# Patient Record
Sex: Male | Born: 1990 | Race: Black or African American | Hispanic: No | Marital: Single | Smoking: Current every day smoker
Health system: Southern US, Community
[De-identification: ages and names within clinical notes are randomized; demographics above are authoritative.]

## PROBLEM LIST (undated history)

## (undated) DIAGNOSIS — T4145XA Adverse effect of unspecified anesthetic, initial encounter: Secondary | ICD-10-CM

## (undated) DIAGNOSIS — F419 Anxiety disorder, unspecified: Secondary | ICD-10-CM

## (undated) DIAGNOSIS — T8859XA Other complications of anesthesia, initial encounter: Secondary | ICD-10-CM

## (undated) DIAGNOSIS — S21119A Laceration without foreign body of unspecified front wall of thorax without penetration into thoracic cavity, initial encounter: Secondary | ICD-10-CM

## (undated) SURGERY — Surgical Case
Anesthesia: *Unknown

---
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
## 2017-10-18 ENCOUNTER — Emergency Department (HOSPITAL_BASED_OUTPATIENT_CLINIC_OR_DEPARTMENT_OTHER)
Admission: EM | Admit: 2017-10-18 | Discharge: 2017-10-19 | Disposition: A | Discharge status: Home / Self Care | Payer: Medicaid Other | Attending: Emergency Medicine | Admitting: Emergency Medicine

## 2017-10-18 ENCOUNTER — Encounter (HOSPITAL_BASED_OUTPATIENT_CLINIC_OR_DEPARTMENT_OTHER): Payer: Self-pay | Admitting: *Deleted

## 2017-10-18 ENCOUNTER — Other Ambulatory Visit: Payer: Self-pay

## 2017-10-18 DIAGNOSIS — K0889 Other specified disorders of teeth and supporting structures: Secondary | ICD-10-CM | POA: Insufficient documentation

## 2017-10-18 DIAGNOSIS — F17200 Nicotine dependence, unspecified, uncomplicated: Secondary | ICD-10-CM | POA: Diagnosis not present

## 2017-10-18 MED ORDER — IBUPROFEN 800 MG PO TABS: 800.0000 mg | ORAL_TABLET | Freq: Three times a day (TID) | ORAL | 0 refills | Status: DC | PRN

## 2017-10-18 MED ORDER — IBUPROFEN 800 MG PO TABS
800.0000 mg | ORAL_TABLET | Freq: Once | ORAL | Status: AC
  Administered 2017-10-18: 800 mg via ORAL
  Filled 2017-10-18: qty 1

## 2017-10-18 MED ORDER — PENICILLIN V POTASSIUM 500 MG PO TABS: 500.0000 mg | ORAL_TABLET | Freq: Three times a day (TID) | ORAL | 0 refills | Status: DC

## 2017-10-18 MED ORDER — PENICILLIN V POTASSIUM 250 MG PO TABS
500.0000 mg | ORAL_TABLET | Freq: Once | ORAL | Status: AC
  Administered 2017-10-18: 500 mg via ORAL
  Filled 2017-10-18: qty 2

## 2017-10-18 NOTE — ED Triage Notes (Signed)
Pt reports right lower wisdom tooth pain for about 2 weeks. Denies drainage or fevers. He took some penicillin that he had from an old prescription and bc powder today.

## 2017-10-18 NOTE — Discharge Instructions (Addendum)
Your evaluated in the emergency department for dental pain. On your exam it appears that your wisdom teeth are erupting. We are prescribing you ibuprofen and penicillin. We are also giving you a number for a dentist. Please followup with dentist for evaluation.

## 2017-10-18 NOTE — ED Provider Notes (Signed)
[facility] EMERGENCY DEPARTMENT Provider Note   CSN: [id number] Arrival date & time: 10/18/17  2153     History   Chief Complaint Chief Complaint  Patient presents with  . Dental Pain    HPI Joseph Stokes is a 27 y.o. male.  He is complaining of 2 weeks of posterior molar pain on both sides of his jaw.  He denies any fever.  He took some Tylenol and a dose of penicillin without any relief.  He is a smoker.  He denies any facial swelling.  No difficulty speaking or swallowing.  He has no dental care.  No trauma  The history is provided by the patient.  Dental Pain   This is a new problem. The current episode started more than 1 week ago. The problem occurs constantly. The problem has not changed since onset.The pain is moderate. He has tried acetaminophen for the symptoms. The treatment provided mild relief.    History reviewed. No pertinent past medical history.  There are no active problems to display for this patient.   History reviewed. No pertinent surgical history.      Home Medications    Prior to Admission medications   Medication Sig Start Date End Date Taking? Authorizing Provider  ibuprofen (ADVIL,MOTRIN) 800 MG tablet Take 1 tablet (800 mg total) by mouth every 8 (eight) hours as needed. 10/18/17   Terrilee FilesButler, Michael C, MD  penicillin v potassium (VEETID) 500 MG tablet Take 1 tablet (500 mg total) by mouth 3 (three) times daily. 10/18/17   Terrilee FilesButler, Michael C, MD    Family History No family history on file.  Social History Social History   Tobacco Use  . Smoking status: Current Every Day Smoker  Substance Use Topics  . Alcohol use: Not Currently  . Drug use: Not Currently     Allergies   Patient has no known allergies.   Review of Systems Review of Systems  Constitutional: Negative for fever.  HENT: Positive for dental problem. Negative for sore throat.   Respiratory: Negative for shortness of breath.   Cardiovascular: Negative for chest  pain.  Gastrointestinal: Negative for abdominal pain.  Genitourinary: Negative for dysuria.  Skin: Negative for rash.     Physical Exam Updated Vital Signs BP (!) 120/59 (BP Location: Left Arm)   Pulse 72   Temp 98.2 F (36.8 C) (Oral)   Resp 14   Ht 5\' 4"  (1.626 m)   Wt 61.2 kg (135 lb)   SpO2 100%   BMI 23.17 kg/m   Physical Exam  Constitutional: He appears well-developed and well-nourished.  HENT:  Head: Normocephalic and atraumatic.  Right Ear: External ear normal.  Left Ear: External ear normal.  Nose: Nose normal.  Mouth/Throat: Oropharynx is clear and moist. No oropharyngeal exudate.  He has bilateral wisdom tooth lower eruptions.  There is some gingival inflammation in the area but no fluctuance and no discharge.  There is no facial swelling.  No trismus and no neck crepitus or erythema.  Eyes: Conjunctivae are normal.  Neck: Neck supple.  Pulmonary/Chest: Effort normal.  Neurological: He is alert. GCS eye subscore is 4. GCS verbal subscore is 5. GCS motor subscore is 6.  Skin: Skin is warm and dry.  Psychiatric: He has a normal mood and affect.  Nursing note and vitals reviewed.    ED Treatments / Results  Labs (all labs ordered are listed, but only abnormal results are displayed) Labs Reviewed - No data to display  EKG None  Radiology No results found.  Procedures Procedures (including critical care time)  Medications Ordered in ED Medications  ibuprofen (ADVIL,MOTRIN) tablet 800 mg (has no administration in time range)  penicillin v potassium (VEETID) tablet 500 mg (has no administration in time range)     Initial Impression / Assessment and Plan / ED Course  I have reviewed the triage vital signs and the nursing notes.  Pertinent labs & imaging results that were available during my care of the patient were reviewed by me and considered in my medical decision making (see chart for details).  Clinical Course as of Oct 19 1049  Fri Oct 18, 2017  2334 Patient presents with dental pain for 2 weeks.  Is nontoxic here and afebrile.  On exam has wisdom tooth eruptions.  I am covering him with antibiotics although I doubt infection is a major cause of this.  Ibuprofen prescription also given.  He understands the need for follow-up with dentist.   [MB]    Clinical Course User Index [MB] Terrilee Files, MD      Final Clinical Impressions(s) / ED Diagnoses   Final diagnoses:  Pain, dental    ED Discharge Orders        Ordered    ibuprofen (ADVIL,MOTRIN) 800 MG tablet  Every 8 hours PRN     10/18/17 2336    penicillin v potassium (VEETID) 500 MG tablet  3 times daily     10/18/17 2336       Terrilee Files, MD 10/19/17 1051

## 2019-04-27 ENCOUNTER — Emergency Department (HOSPITAL_COMMUNITY): Payer: Medicaid Other

## 2019-04-27 ENCOUNTER — Encounter (HOSPITAL_COMMUNITY): Payer: Self-pay | Admitting: Urology

## 2019-04-27 ENCOUNTER — Other Ambulatory Visit: Payer: Self-pay

## 2019-04-27 ENCOUNTER — Inpatient Hospital Stay (HOSPITAL_COMMUNITY)
Admission: EM | Admit: 2019-04-27 | Discharge: 2019-05-07 | DRG: 956 | Disposition: A | Discharge status: Rehabilitation Facility | Payer: Medicaid Other | Attending: General Surgery | Admitting: General Surgery

## 2019-04-27 DIAGNOSIS — W3400XA Accidental discharge from unspecified firearms or gun, initial encounter: Secondary | ICD-10-CM | POA: Diagnosis not present

## 2019-04-27 DIAGNOSIS — T1490XA Injury, unspecified, initial encounter: Secondary | ICD-10-CM | POA: Diagnosis not present

## 2019-04-27 DIAGNOSIS — Z79899 Other long term (current) drug therapy: Secondary | ICD-10-CM

## 2019-04-27 DIAGNOSIS — S85182A Other specified injury of posterior tibial artery, left leg, initial encounter: Secondary | ICD-10-CM | POA: Diagnosis present

## 2019-04-27 DIAGNOSIS — E875 Hyperkalemia: Secondary | ICD-10-CM | POA: Diagnosis not present

## 2019-04-27 DIAGNOSIS — G8918 Other acute postprocedural pain: Secondary | ICD-10-CM

## 2019-04-27 DIAGNOSIS — S85891A Other specified injury of other blood vessels at lower leg level, right leg, initial encounter: Secondary | ICD-10-CM | POA: Diagnosis not present

## 2019-04-27 DIAGNOSIS — S3729XA Other injury of bladder, initial encounter: Secondary | ICD-10-CM | POA: Diagnosis present

## 2019-04-27 DIAGNOSIS — F411 Generalized anxiety disorder: Secondary | ICD-10-CM | POA: Diagnosis not present

## 2019-04-27 DIAGNOSIS — Z634 Disappearance and death of family member: Secondary | ICD-10-CM | POA: Diagnosis not present

## 2019-04-27 DIAGNOSIS — D62 Acute posthemorrhagic anemia: Secondary | ICD-10-CM

## 2019-04-27 DIAGNOSIS — T148XXA Other injury of unspecified body region, initial encounter: Secondary | ICD-10-CM

## 2019-04-27 DIAGNOSIS — S76122A Laceration of left quadriceps muscle, fascia and tendon, initial encounter: Secondary | ICD-10-CM | POA: Diagnosis present

## 2019-04-27 DIAGNOSIS — I959 Hypotension, unspecified: Secondary | ICD-10-CM | POA: Diagnosis present

## 2019-04-27 DIAGNOSIS — D696 Thrombocytopenia, unspecified: Secondary | ICD-10-CM | POA: Diagnosis not present

## 2019-04-27 DIAGNOSIS — S52201B Unspecified fracture of shaft of right ulna, initial encounter for open fracture type I or II: Secondary | ICD-10-CM | POA: Diagnosis present

## 2019-04-27 DIAGNOSIS — T07XXXA Unspecified multiple injuries, initial encounter: Secondary | ICD-10-CM | POA: Diagnosis not present

## 2019-04-27 DIAGNOSIS — S91331A Puncture wound without foreign body, right foot, initial encounter: Secondary | ICD-10-CM | POA: Diagnosis present

## 2019-04-27 DIAGNOSIS — D72829 Elevated white blood cell count, unspecified: Secondary | ICD-10-CM

## 2019-04-27 DIAGNOSIS — Z20828 Contact with and (suspected) exposure to other viral communicable diseases: Secondary | ICD-10-CM | POA: Diagnosis present

## 2019-04-27 DIAGNOSIS — S92001B Unspecified fracture of right calcaneus, initial encounter for open fracture: Secondary | ICD-10-CM | POA: Diagnosis present

## 2019-04-27 DIAGNOSIS — S32451A Displaced transverse fracture of right acetabulum, initial encounter for closed fracture: Secondary | ICD-10-CM | POA: Diagnosis present

## 2019-04-27 DIAGNOSIS — R7401 Elevation of levels of liver transaminase levels: Secondary | ICD-10-CM | POA: Diagnosis not present

## 2019-04-27 DIAGNOSIS — S82452A Displaced comminuted fracture of shaft of left fibula, initial encounter for closed fracture: Secondary | ICD-10-CM | POA: Diagnosis present

## 2019-04-27 DIAGNOSIS — E8809 Other disorders of plasma-protein metabolism, not elsewhere classified: Secondary | ICD-10-CM | POA: Diagnosis not present

## 2019-04-27 DIAGNOSIS — Z419 Encounter for procedure for purposes other than remedying health state, unspecified: Secondary | ICD-10-CM

## 2019-04-27 DIAGNOSIS — N179 Acute kidney failure, unspecified: Secondary | ICD-10-CM | POA: Diagnosis present

## 2019-04-27 DIAGNOSIS — S85892A Other specified injury of other blood vessels at lower leg level, left leg, initial encounter: Secondary | ICD-10-CM | POA: Diagnosis not present

## 2019-04-27 DIAGNOSIS — Z23 Encounter for immunization: Secondary | ICD-10-CM | POA: Diagnosis not present

## 2019-04-27 DIAGNOSIS — S32591A Other specified fracture of right pubis, initial encounter for closed fracture: Secondary | ICD-10-CM | POA: Diagnosis present

## 2019-04-27 DIAGNOSIS — Z1810 Retained metal fragments, unspecified: Secondary | ICD-10-CM | POA: Diagnosis not present

## 2019-04-27 DIAGNOSIS — G479 Sleep disorder, unspecified: Secondary | ICD-10-CM | POA: Diagnosis not present

## 2019-04-27 DIAGNOSIS — R000 Tachycardia, unspecified: Secondary | ICD-10-CM | POA: Diagnosis not present

## 2019-04-27 DIAGNOSIS — E46 Unspecified protein-calorie malnutrition: Secondary | ICD-10-CM | POA: Diagnosis not present

## 2019-04-27 DIAGNOSIS — E871 Hypo-osmolality and hyponatremia: Secondary | ICD-10-CM | POA: Diagnosis not present

## 2019-04-27 DIAGNOSIS — Z452 Encounter for adjustment and management of vascular access device: Secondary | ICD-10-CM

## 2019-04-27 DIAGNOSIS — S32810D Multiple fractures of pelvis with stable disruption of pelvic ring, subsequent encounter for fracture with routine healing: Secondary | ICD-10-CM | POA: Diagnosis not present

## 2019-04-27 DIAGNOSIS — S72051A Unspecified fracture of head of right femur, initial encounter for closed fracture: Principal | ICD-10-CM | POA: Diagnosis present

## 2019-04-27 DIAGNOSIS — F17210 Nicotine dependence, cigarettes, uncomplicated: Secondary | ICD-10-CM | POA: Diagnosis present

## 2019-04-27 DIAGNOSIS — D72828 Other elevated white blood cell count: Secondary | ICD-10-CM | POA: Diagnosis not present

## 2019-04-27 HISTORY — DX: Laceration without foreign body of unspecified front wall of thorax without penetration into thoracic cavity, initial encounter: S21.119A

## 2019-04-27 HISTORY — DX: Anxiety disorder, unspecified: F41.9

## 2019-04-27 LAB — URINALYSIS, ROUTINE W REFLEX MICROSCOPIC
Bacteria, UA: NONE SEEN
Bilirubin Urine: NEGATIVE
Glucose, UA: NEGATIVE mg/dL
Ketones, ur: NEGATIVE mg/dL
Nitrite: NEGATIVE
Protein, ur: NEGATIVE mg/dL
RBC / HPF: >50 RBC/hpf — ABNORMAL HIGH (ref 0–5)
Specific Gravity, Urine: >1.046 — ABNORMAL HIGH (ref 1.005–1.030)
pH: 6 (ref 5.0–8.0)

## 2019-04-27 LAB — I-STAT CHEM 8, ED
BUN: 15 mg/dL (ref 6–20)
Calcium, Ion: 1.1 mmol/L — ABNORMAL LOW (ref 1.15–1.40)
Chloride: 109 mmol/L (ref 98–111)
Creatinine, Ser: 1.5 mg/dL — ABNORMAL HIGH (ref 0.61–1.24)
Glucose, Bld: 171 mg/dL — ABNORMAL HIGH (ref 70–99)
HCT: 41 % (ref 39.0–52.0)
Hemoglobin: 13.9 g/dL (ref 13.0–17.0)
Potassium: 3.1 mmol/L — ABNORMAL LOW (ref 3.5–5.1)
Sodium: 142 mmol/L (ref 135–145)
TCO2: 16 mmol/L — ABNORMAL LOW (ref 22–32)

## 2019-04-27 LAB — COMPREHENSIVE METABOLIC PANEL
ALT: 18 U/L (ref 0–44)
AST: 28 U/L (ref 15–41)
Albumin: 3.5 g/dL (ref 3.5–5.0)
Alkaline Phosphatase: 54 U/L (ref 38–126)
Anion gap: 14 (ref 5–15)
BUN: 14 mg/dL (ref 6–20)
CO2: 16 mmol/L — ABNORMAL LOW (ref 22–32)
Calcium: 8.7 mg/dL — ABNORMAL LOW (ref 8.9–10.3)
Chloride: 109 mmol/L (ref 98–111)
Creatinine, Ser: 1.52 mg/dL — ABNORMAL HIGH (ref 0.61–1.24)
GFR calc Af Amer: >60 mL/min (ref >60)
GFR calc non Af Amer: >60 mL/min (ref >60)
Glucose, Bld: 182 mg/dL — ABNORMAL HIGH (ref 70–99)
Potassium: 3 mmol/L — ABNORMAL LOW (ref 3.5–5.1)
Sodium: 139 mmol/L (ref 135–145)
Total Bilirubin: 0.3 mg/dL (ref 0.3–1.2)
Total Protein: 6 g/dL — ABNORMAL LOW (ref 6.5–8.1)

## 2019-04-27 LAB — CBC
HCT: 39.2 % (ref 39.0–52.0)
Hemoglobin: 13.7 g/dL (ref 13.0–17.0)
MCH: 31.6 pg (ref 26.0–34.0)
MCHC: 34.9 g/dL (ref 30.0–36.0)
MCV: 90.5 fL (ref 80.0–100.0)
Platelets: 263 10*3/uL (ref 150–400)
RBC: 4.33 MIL/uL (ref 4.22–5.81)
RDW: 12.8 % (ref 11.5–15.5)
WBC: 9.9 10*3/uL (ref 4.0–10.5)
nRBC: 0 % (ref 0.0–0.2)

## 2019-04-27 LAB — LACTIC ACID, PLASMA: Lactic Acid, Venous: 5.2 mmol/L (ref 0.5–1.9)

## 2019-04-27 LAB — PREPARE RBC (CROSSMATCH)

## 2019-04-27 LAB — ABO/RH: ABO/RH(D): B POS

## 2019-04-27 LAB — ETHANOL: Alcohol, Ethyl (B): <10 mg/dL (ref <10)

## 2019-04-27 LAB — SARS CORONAVIRUS 2 BY RT PCR (HOSPITAL ORDER, PERFORMED IN ~~LOC~~ HOSPITAL LAB): SARS Coronavirus 2: NEGATIVE

## 2019-04-27 LAB — PROTIME-INR
INR: 1 (ref 0.8–1.2)
Prothrombin Time: 13.5 seconds (ref 11.4–15.2)

## 2019-04-27 MED ORDER — IOHEXOL 350 MG/ML SOLN
100.0000 mL | Freq: Once | INTRAVENOUS | Status: AC | PRN
  Administered 2019-04-27: 100 mL via INTRAVENOUS

## 2019-04-27 MED ORDER — TETANUS-DIPHTH-ACELL PERTUSSIS 5-2.5-18.5 LF-MCG/0.5 IM SUSP
0.5000 mL | Freq: Once | INTRAMUSCULAR | Status: AC
  Administered 2019-04-27: 0.5 mL via INTRAMUSCULAR

## 2019-04-27 MED ORDER — PANTOPRAZOLE SODIUM 40 MG IV SOLR
40.0000 mg | Freq: Every day | INTRAVENOUS | Status: DC
  Administered 2019-04-27 – 2019-04-29 (×2): 40 mg via INTRAVENOUS
  Filled 2019-04-27 (×2): qty 40

## 2019-04-27 MED ORDER — HYDROMORPHONE HCL 1 MG/ML IJ SOLN: 0.5000 mg | INTRAMUSCULAR | Status: DC | PRN

## 2019-04-27 MED ORDER — SODIUM CHLORIDE 0.9 % IV SOLN
10.0000 mL/h | Freq: Once | INTRAVENOUS | Status: AC
  Administered 2019-04-27: 10 mL/h via INTRAVENOUS

## 2019-04-27 MED ORDER — HYDROMORPHONE HCL 1 MG/ML IJ SOLN
1.0000 mg | INTRAMUSCULAR | Status: DC | PRN
  Administered 2019-04-27 (×2): 1 mg via INTRAVENOUS
  Administered 2019-04-28: 2 mg via INTRAVENOUS
  Administered 2019-04-28 – 2019-05-01 (×10): 1 mg via INTRAVENOUS
  Filled 2019-04-27 (×14): qty 1

## 2019-04-27 MED ORDER — POTASSIUM CHLORIDE IN NACL 20-0.9 MEQ/L-% IV SOLN
INTRAVENOUS | Status: DC
  Administered 2019-04-27 – 2019-04-28 (×2): via INTRAVENOUS
  Filled 2019-04-27 (×2): qty 1000

## 2019-04-27 MED ORDER — FENTANYL CITRATE (PF) 100 MCG/2ML IJ SOLN
50.0000 ug | Freq: Once | INTRAMUSCULAR | Status: DC
  Filled 2019-04-27: qty 2

## 2019-04-27 MED ORDER — CEFAZOLIN SODIUM-DEXTROSE 2-4 GM/100ML-% IV SOLN
2.0000 g | Freq: Once | INTRAVENOUS | Status: AC
  Administered 2019-04-27: 2 g via INTRAVENOUS

## 2019-04-27 MED ORDER — ONDANSETRON HCL 4 MG/2ML IJ SOLN
INTRAMUSCULAR | Status: AC
  Filled 2019-04-27: qty 2

## 2019-04-27 MED ORDER — LORAZEPAM 2 MG/ML IJ SOLN
0.5000 mg | Freq: Once | INTRAMUSCULAR | Status: AC
  Administered 2019-04-27: 0.5 mg via INTRAVENOUS
  Filled 2019-04-27: qty 1

## 2019-04-27 MED ORDER — FENTANYL CITRATE (PF) 100 MCG/2ML IJ SOLN
INTRAMUSCULAR | Status: AC | PRN
  Administered 2019-04-27: 50 ug via INTRAVENOUS

## 2019-04-27 MED ORDER — HYDROMORPHONE HCL 1 MG/ML IJ SOLN
1.0000 mg | Freq: Once | INTRAMUSCULAR | Status: DC
  Filled 2019-04-27: qty 1

## 2019-04-27 MED ORDER — PANTOPRAZOLE SODIUM 40 MG PO TBEC: 40.0000 mg | DELAYED_RELEASE_TABLET | Freq: Every day | ORAL | Status: DC

## 2019-04-27 MED ORDER — METOPROLOL TARTRATE 5 MG/5ML IV SOLN
5.0000 mg | Freq: Four times a day (QID) | INTRAVENOUS | Status: DC | PRN
  Administered 2019-04-28 – 2019-05-06 (×2): 5 mg via INTRAVENOUS
  Filled 2019-04-27 (×3): qty 5

## 2019-04-27 MED ORDER — METHOCARBAMOL 1000 MG/10ML IJ SOLN
1000.0000 mg | Freq: Three times a day (TID) | INTRAVENOUS | Status: DC | PRN
  Filled 2019-04-27 (×5): qty 10

## 2019-04-27 MED ORDER — CEFAZOLIN SODIUM-DEXTROSE 1-4 GM/50ML-% IV SOLN
1.0000 g | Freq: Three times a day (TID) | INTRAVENOUS | Status: DC
  Administered 2019-04-27 – 2019-04-28 (×2): 1 g via INTRAVENOUS
  Filled 2019-04-27 (×6): qty 50

## 2019-04-27 MED ORDER — LIDOCAINE-EPINEPHRINE (PF) 2 %-1:200000 IJ SOLN
10.0000 mL | Freq: Once | INTRAMUSCULAR | Status: AC
  Administered 2019-04-27: 10 mL via INTRADERMAL
  Filled 2019-04-27: qty 20

## 2019-04-27 MED ORDER — FENTANYL CITRATE (PF) 100 MCG/2ML IJ SOLN
INTRAMUSCULAR | Status: AC
  Filled 2019-04-27: qty 2

## 2019-04-27 NOTE — Progress Notes (Signed)
Orthopedic Tech Progress Note Patient Details:  Joseph Stokes 1990-09-04 [id number]  Patient ID: [name], male   DOB: [birth year], 28 y.o.   MRN: [id number]   [name] 04/27/2019, 6:56 PMLevel one Trauma.

## 2019-04-27 NOTE — Plan of Care (Signed)
Nurse asked me to have prayer with patient. Pt. Just found out that his girlfriend did not make it. Pt. was tearful. He was grateful for the prayer offered at bedside.

## 2019-04-27 NOTE — ED Notes (Signed)
Rate adjusted on PRBC's to 963ml/hr

## 2019-04-27 NOTE — ED Notes (Signed)
Vascular MD at the bedside   

## 2019-04-27 NOTE — ED Notes (Signed)
Trauma MD paged regarding pt HR
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
## 2019-04-27 NOTE — ED Notes (Signed)
Urologist at the bedside  
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
## 2019-04-27 NOTE — ED Notes (Signed)
Rate on PRBC increased to 953ml/hr

## 2019-04-27 NOTE — ED Notes (Signed)
Orthopedic MD at the bedside. 

## 2019-04-27 NOTE — ED Notes (Signed)
Pt gave verbal ok for blood administration. Heard by this RN and Kai Levins, RN. Pt states he does not feel well enough to sign at this time.
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
## 2019-04-27 NOTE — ED Notes (Signed)
Patient getting PRBC at this time. Unable to collect labs due now due to PRBC running.

## 2019-04-27 NOTE — ED Notes (Signed)
During administration of second unit of Plasma, pt with "funny" feeling in his chest. Pt sat up and said he did not feel "right". Plasma stopped at that time and ED MD made aware.
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
## 2019-04-27 NOTE — ED Notes (Signed)
Pt started vomiting while ED MD placing sutures. Verbal order for 4mg  Zofran IV given to this RN by Regan Lemming.

## 2019-04-27 NOTE — Progress Notes (Signed)
Orthopedic Tech Progress Note Patient Details:  Joseph Stokes January 11, 1991 [id number]  Ortho Devices Type of Ortho Device: Sugartong splint Ortho Device/Splint Interventions: Application, Ordered   Post Interventions Patient Tolerated: Well  Musculoskeletal Traction Type of Traction: Bucks Skin Traction Traction Weight: 5 lbs   Post Interventions Patient Tolerated: Well   Melony Overly T 04/27/2019, 10:57 PM

## 2019-04-27 NOTE — ED Notes (Signed)
Per MD Grandville Silos, administer 2 more units of PRBC's

## 2019-04-28 ENCOUNTER — Encounter (HOSPITAL_COMMUNITY): Admission: EM | Disposition: A | Discharge status: Rehabilitation Facility | Payer: Self-pay | Source: Home / Self Care

## 2019-04-28 ENCOUNTER — Encounter (HOSPITAL_COMMUNITY): Payer: Self-pay | Admitting: Certified Registered"

## 2019-04-28 ENCOUNTER — Inpatient Hospital Stay (HOSPITAL_COMMUNITY): Payer: Medicaid Other

## 2019-04-28 ENCOUNTER — Inpatient Hospital Stay (HOSPITAL_COMMUNITY): Payer: Medicaid Other | Admitting: Anesthesiology

## 2019-04-28 HISTORY — PX: I & D EXTREMITY: SHX5045

## 2019-04-28 HISTORY — PX: ORIF ULNAR FRACTURE: SHX5417

## 2019-04-28 HISTORY — PX: INCISION AND DRAINAGE OF WOUND: SHX1803

## 2019-04-28 HISTORY — PX: ABDOMINAL WALL DEFECT REPAIR: SHX53

## 2019-04-28 HISTORY — PX: APPLICATION OF WOUND VAC: SHX5189

## 2019-04-28 HISTORY — PX: INCISION AND DRAINAGE PERIRECTAL ABSCESS: SHX1804

## 2019-04-28 HISTORY — PX: ORIF PELVIC FRACTURE: SHX2128

## 2019-04-28 LAB — TYPE AND SCREEN
ABO/RH(D): B POS
Antibody Screen: NEGATIVE
Unit division: 0
Unit division: 0
Unit division: 0
Unit division: 0

## 2019-04-28 LAB — BPAM FFP
Blood Product Expiration Date: 202010222359
Blood Product Expiration Date: 202010222359
ISSUE DATE / TIME: 202010121932
ISSUE DATE / TIME: 202010121932
Unit Type and Rh: 6200
Unit Type and Rh: 6200

## 2019-04-28 LAB — BPAM RBC
Blood Product Expiration Date: 202010302359
Blood Product Expiration Date: 202010302359
Blood Product Expiration Date: 202011102359
Blood Product Expiration Date: 202011102359
ISSUE DATE / TIME: 202010121900
ISSUE DATE / TIME: 202010121900
ISSUE DATE / TIME: 202010122144
ISSUE DATE / TIME: 202010122240
Unit Type and Rh: 5100
Unit Type and Rh: 5100
Unit Type and Rh: 7300
Unit Type and Rh: 7300

## 2019-04-28 LAB — PREPARE FRESH FROZEN PLASMA
Unit division: 0
Unit division: 0

## 2019-04-28 LAB — CBC
HCT: 46.1 % (ref 39.0–52.0)
HCT: 46 % (ref 39.0–52.0)
Hemoglobin: 16.6 g/dL (ref 13.0–17.0)
Hemoglobin: 16.4 g/dL (ref 13.0–17.0)
MCH: 32.2 pg (ref 26.0–34.0)
MCH: 31.7 pg (ref 26.0–34.0)
MCHC: 36 g/dL (ref 30.0–36.0)
MCHC: 35.7 g/dL (ref 30.0–36.0)
MCV: 89.3 fL (ref 80.0–100.0)
MCV: 88.8 fL (ref 80.0–100.0)
Platelets: 133 10*3/uL — ABNORMAL LOW (ref 150–400)
Platelets: 135 10*3/uL — ABNORMAL LOW (ref 150–400)
RBC: 5.16 MIL/uL (ref 4.22–5.81)
RBC: 5.18 MIL/uL (ref 4.22–5.81)
RDW: 13.1 % (ref 11.5–15.5)
RDW: 13.4 % (ref 11.5–15.5)
WBC: 15.9 10*3/uL — ABNORMAL HIGH (ref 4.0–10.5)
WBC: 18.9 10*3/uL — ABNORMAL HIGH (ref 4.0–10.5)
nRBC: 0 % (ref 0.0–0.2)
nRBC: 0 % (ref 0.0–0.2)

## 2019-04-28 LAB — LACTIC ACID, PLASMA
Lactic Acid, Venous: 4.1 mmol/L (ref 0.5–1.9)
Lactic Acid, Venous: 5 mmol/L (ref 0.5–1.9)

## 2019-04-28 LAB — SURGICAL PCR SCREEN
MRSA, PCR: NEGATIVE
Staphylococcus aureus: NEGATIVE

## 2019-04-28 LAB — HIV ANTIBODY (ROUTINE TESTING W REFLEX): HIV Screen 4th Generation wRfx: NONREACTIVE

## 2019-04-28 LAB — RAPID URINE DRUG SCREEN, HOSP PERFORMED
Amphetamines: NOT DETECTED
Barbiturates: NOT DETECTED
Benzodiazepines: POSITIVE — AB
Cocaine: NOT DETECTED
Opiates: NOT DETECTED
Tetrahydrocannabinol: POSITIVE — AB

## 2019-04-28 LAB — BASIC METABOLIC PANEL
Anion gap: 12 (ref 5–15)
Anion gap: 11 (ref 5–15)
BUN: 18 mg/dL (ref 6–20)
BUN: 11 mg/dL (ref 6–20)
CO2: 17 mmol/L — ABNORMAL LOW (ref 22–32)
CO2: 20 mmol/L — ABNORMAL LOW (ref 22–32)
Calcium: 7.8 mg/dL — ABNORMAL LOW (ref 8.9–10.3)
Calcium: 6.6 mg/dL — ABNORMAL LOW (ref 8.9–10.3)
Chloride: 112 mmol/L — ABNORMAL HIGH (ref 98–111)
Chloride: 108 mmol/L (ref 98–111)
Creatinine, Ser: 1.92 mg/dL — ABNORMAL HIGH (ref 0.61–1.24)
Creatinine, Ser: 1.25 mg/dL — ABNORMAL HIGH (ref 0.61–1.24)
GFR calc Af Amer: 54 mL/min — ABNORMAL LOW (ref >60)
GFR calc Af Amer: >60 mL/min (ref >60)
GFR calc non Af Amer: 47 mL/min — ABNORMAL LOW (ref >60)
GFR calc non Af Amer: >60 mL/min (ref >60)
Glucose, Bld: 165 mg/dL — ABNORMAL HIGH (ref 70–99)
Glucose, Bld: 118 mg/dL — ABNORMAL HIGH (ref 70–99)
Potassium: 5.7 mmol/L — ABNORMAL HIGH (ref 3.5–5.1)
Potassium: 4 mmol/L (ref 3.5–5.1)
Sodium: 141 mmol/L (ref 135–145)
Sodium: 139 mmol/L (ref 135–145)

## 2019-04-28 LAB — PREPARE RBC (CROSSMATCH)

## 2019-04-28 LAB — GLUCOSE, CAPILLARY: Glucose-Capillary: 127 mg/dL — ABNORMAL HIGH (ref 70–99)

## 2019-04-28 SURGERY — IRRIGATION AND DEBRIDEMENT EXTREMITY
Anesthesia: General | Site: Perineum | Laterality: Right

## 2019-04-28 MED ORDER — ALBUMIN HUMAN 5 % IV SOLN
INTRAVENOUS | Status: DC | PRN
  Administered 2019-04-28: 10:00:00 via INTRAVENOUS

## 2019-04-28 MED ORDER — DEXAMETHASONE SODIUM PHOSPHATE 10 MG/ML IJ SOLN
INTRAMUSCULAR | Status: AC
  Filled 2019-04-28: qty 1

## 2019-04-28 MED ORDER — ALBUMIN HUMAN 5 % IV SOLN
12.5000 g | Freq: Once | INTRAVENOUS | Status: AC
  Administered 2019-04-28: 12.5 g via INTRAVENOUS
  Filled 2019-04-28: qty 250

## 2019-04-28 MED ORDER — FENTANYL CITRATE (PF) 100 MCG/2ML IJ SOLN
25.0000 ug | INTRAMUSCULAR | Status: DC | PRN
  Administered 2019-04-28 (×2): 50 ug via INTRAVENOUS

## 2019-04-28 MED ORDER — CEFAZOLIN SODIUM-DEXTROSE 2-3 GM-%(50ML) IV SOLR
INTRAVENOUS | Status: DC | PRN
  Administered 2019-04-28: 2 g via INTRAVENOUS

## 2019-04-28 MED ORDER — PROPOFOL 10 MG/ML IV BOLUS
INTRAVENOUS | Status: AC
  Filled 2019-04-28: qty 20

## 2019-04-28 MED ORDER — PROPOFOL 10 MG/ML IV BOLUS
INTRAVENOUS | Status: DC | PRN
  Administered 2019-04-28: 130 mg via INTRAVENOUS

## 2019-04-28 MED ORDER — NON FORMULARY
Status: DC | PRN
  Administered 2019-04-28 (×4): 118 mL via TOPICAL

## 2019-04-28 MED ORDER — MIDAZOLAM HCL 2 MG/2ML IJ SOLN
INTRAMUSCULAR | Status: AC
  Filled 2019-04-28: qty 2

## 2019-04-28 MED ORDER — LIDOCAINE 2% (20 MG/ML) 5 ML SYRINGE
INTRAMUSCULAR | Status: DC | PRN
  Administered 2019-04-28: 100 mg via INTRAVENOUS

## 2019-04-28 MED ORDER — SODIUM CHLORIDE 0.9 % IV SOLN
2.0000 g | INTRAVENOUS | Status: DC
  Administered 2019-04-28 – 2019-05-04 (×7): 2 g via INTRAVENOUS
  Filled 2019-04-28: qty 20
  Filled 2019-04-28: qty 2
  Filled 2019-04-28 (×3): qty 20
  Filled 2019-04-28: qty 2
  Filled 2019-04-28 (×5): qty 20

## 2019-04-28 MED ORDER — SODIUM CHLORIDE 0.9 % IR SOLN
Status: DC | PRN
  Administered 2019-04-28 (×8): 3000 mL
  Administered 2019-04-28: 9000 mL
  Administered 2019-04-28 (×2): 3000 mL

## 2019-04-28 MED ORDER — TOBRAMYCIN SULFATE 80 MG/2ML IJ SOLN
INTRAMUSCULAR | Status: AC
  Filled 2019-04-28: qty 2

## 2019-04-28 MED ORDER — FENTANYL CITRATE (PF) 250 MCG/5ML IJ SOLN
INTRAMUSCULAR | Status: AC
  Filled 2019-04-28: qty 5

## 2019-04-28 MED ORDER — ACETAMINOPHEN 500 MG PO TABS
1000.0000 mg | ORAL_TABLET | Freq: Three times a day (TID) | ORAL | Status: DC
  Administered 2019-04-28 – 2019-04-29 (×3): 1000 mg via ORAL
  Filled 2019-04-28 (×4): qty 2

## 2019-04-28 MED ORDER — VANCOMYCIN HCL 1000 MG IV SOLR
INTRAVENOUS | Status: AC
  Filled 2019-04-28: qty 1000

## 2019-04-28 MED ORDER — FENTANYL CITRATE (PF) 100 MCG/2ML IJ SOLN
INTRAMUSCULAR | Status: AC
  Filled 2019-04-28: qty 2

## 2019-04-28 MED ORDER — SODIUM BICARBONATE 8.4 % IV SOLN
INTRAVENOUS | Status: DC | PRN
  Administered 2019-04-28 (×3): 50 meq via INTRAVENOUS

## 2019-04-28 MED ORDER — DEXTROSE 50 % IV SOLN
12.5000 mL | Freq: Once | INTRAVENOUS | Status: AC
  Administered 2019-04-28: 09:00:00 12.5 mL via INTRAVENOUS
  Filled 2019-04-28: qty 50

## 2019-04-28 MED ORDER — ROCURONIUM BROMIDE 10 MG/ML (PF) SYRINGE
PREFILLED_SYRINGE | INTRAVENOUS | Status: DC | PRN
  Administered 2019-04-28: 40 mg via INTRAVENOUS
  Administered 2019-04-28: 20 mg via INTRAVENOUS
  Administered 2019-04-28: 60 mg via INTRAVENOUS
  Administered 2019-04-28: 20 mg via INTRAVENOUS

## 2019-04-28 MED ORDER — 0.9 % SODIUM CHLORIDE (POUR BTL) OPTIME
TOPICAL | Status: DC | PRN
  Administered 2019-04-28 (×2): 1000 mL

## 2019-04-28 MED ORDER — LACTATED RINGERS IV SOLN
INTRAVENOUS | Status: DC | PRN
  Administered 2019-04-28 (×3): via INTRAVENOUS

## 2019-04-28 MED ORDER — ROCURONIUM BROMIDE 10 MG/ML (PF) SYRINGE
PREFILLED_SYRINGE | INTRAVENOUS | Status: AC
  Filled 2019-04-28: qty 10

## 2019-04-28 MED ORDER — VANCOMYCIN HCL IN DEXTROSE 1-5 GM/200ML-% IV SOLN
INTRAVENOUS | Status: AC
  Filled 2019-04-28: qty 200

## 2019-04-28 MED ORDER — DEXAMETHASONE SODIUM PHOSPHATE 10 MG/ML IJ SOLN
INTRAMUSCULAR | Status: DC | PRN
  Administered 2019-04-28: 4 mg via INTRAVENOUS

## 2019-04-28 MED ORDER — INSULIN ASPART 100 UNIT/ML ~~LOC~~ SOLN
SUBCUTANEOUS | Status: AC
  Administered 2019-04-28: 8 [IU] via INTRAVENOUS
  Filled 2019-04-28: qty 1

## 2019-04-28 MED ORDER — CHLORHEXIDINE GLUCONATE CLOTH 2 % EX PADS
6.0000 | MEDICATED_PAD | Freq: Every day | CUTANEOUS | Status: AC
  Administered 2019-04-28 – 2019-05-02 (×4): 6 via TOPICAL

## 2019-04-28 MED ORDER — ALBUMIN HUMAN 5 % IV SOLN: 25.0000 g | Freq: Once | INTRAVENOUS | Status: DC

## 2019-04-28 MED ORDER — ONDANSETRON HCL 4 MG/2ML IJ SOLN
INTRAMUSCULAR | Status: AC
  Filled 2019-04-28: qty 2

## 2019-04-28 MED ORDER — SODIUM CHLORIDE 0.9 % IV SOLN
INTRAVENOUS | Status: DC
  Administered 2019-04-28 – 2019-05-04 (×9): via INTRAVENOUS

## 2019-04-28 MED ORDER — OXYCODONE HCL 5 MG/5ML PO SOLN: 5.0000 mg | Freq: Once | ORAL | Status: DC | PRN

## 2019-04-28 MED ORDER — OXYCODONE HCL 5 MG PO TABS
10.0000 mg | ORAL_TABLET | ORAL | Status: DC | PRN
  Administered 2019-04-28 – 2019-04-29 (×4): 10 mg via ORAL
  Filled 2019-04-28 (×5): qty 2

## 2019-04-28 MED ORDER — INSULIN ASPART 100 UNIT/ML ~~LOC~~ SOLN
8.0000 [IU] | Freq: Once | SUBCUTANEOUS | Status: AC
  Administered 2019-04-28: 09:00:00 8 [IU] via INTRAVENOUS
  Filled 2019-04-28: qty 0.08

## 2019-04-28 MED ORDER — PIPERACILLIN-TAZOBACTAM 3.375 G IVPB 30 MIN
3.3750 g | INTRAVENOUS | Status: AC
  Administered 2019-04-28: 3.375 g via INTRAVENOUS
  Filled 2019-04-28: qty 50

## 2019-04-28 MED ORDER — METHOCARBAMOL 500 MG PO TABS
ORAL_TABLET | ORAL | Status: AC
  Filled 2019-04-28: qty 2

## 2019-04-28 MED ORDER — TOBRAMYCIN SULFATE 80 MG/2ML IJ SOLN
INTRAMUSCULAR | Status: DC | PRN
  Administered 2019-04-28: 80 mg

## 2019-04-28 MED ORDER — ONDANSETRON HCL 4 MG/2ML IJ SOLN: 4.0000 mg | Freq: Once | INTRAMUSCULAR | Status: DC | PRN

## 2019-04-28 MED ORDER — SODIUM CHLORIDE 0.9% IV SOLUTION: Freq: Once | INTRAVENOUS | Status: DC

## 2019-04-28 MED ORDER — OXYCODONE HCL 5 MG PO TABS: 5.0000 mg | ORAL_TABLET | Freq: Once | ORAL | Status: DC | PRN

## 2019-04-28 MED ORDER — MIDAZOLAM HCL 5 MG/5ML IJ SOLN
INTRAMUSCULAR | Status: DC | PRN
  Administered 2019-04-28: 2 mg via INTRAVENOUS

## 2019-04-28 MED ORDER — MUPIROCIN 2 % EX OINT
1.0000 "application " | TOPICAL_OINTMENT | Freq: Two times a day (BID) | CUTANEOUS | Status: DC
  Administered 2019-04-28 (×2): 1 via NASAL
  Filled 2019-04-28: qty 22

## 2019-04-28 MED ORDER — METRONIDAZOLE IN NACL 5-0.79 MG/ML-% IV SOLN
500.0000 mg | Freq: Three times a day (TID) | INTRAVENOUS | Status: DC
  Administered 2019-04-28 – 2019-05-05 (×18): 500 mg via INTRAVENOUS
  Filled 2019-04-28 (×18): qty 100

## 2019-04-28 MED ORDER — DEXTROSE 50 % IV SOLN
INTRAVENOUS | Status: AC
  Administered 2019-04-28: 12.5 mL via INTRAVENOUS
  Filled 2019-04-28: qty 50

## 2019-04-28 MED ORDER — METHOCARBAMOL 500 MG PO TABS
750.0000 mg | ORAL_TABLET | Freq: Three times a day (TID) | ORAL | Status: DC
  Administered 2019-04-28 – 2019-05-06 (×21): 750 mg via ORAL
  Filled 2019-04-28: qty 2
  Filled 2019-04-28: qty 1
  Filled 2019-04-28 (×3): qty 2
  Filled 2019-04-28: qty 1
  Filled 2019-04-28: qty 2
  Filled 2019-04-28 (×2): qty 1
  Filled 2019-04-28: qty 2
  Filled 2019-04-28: qty 1
  Filled 2019-04-28 (×5): qty 2
  Filled 2019-04-28: qty 1
  Filled 2019-04-28 (×6): qty 2

## 2019-04-28 MED ORDER — VANCOMYCIN HCL 1000 MG IV SOLR
INTRAVENOUS | Status: DC | PRN
  Administered 2019-04-28: 1000 mg

## 2019-04-28 MED ORDER — FENTANYL CITRATE (PF) 100 MCG/2ML IJ SOLN
INTRAMUSCULAR | Status: DC | PRN
  Administered 2019-04-28 (×3): 50 ug via INTRAVENOUS
  Administered 2019-04-28 (×2): 100 ug via INTRAVENOUS
  Administered 2019-04-28 (×3): 50 ug via INTRAVENOUS

## 2019-04-28 MED ORDER — ONDANSETRON HCL 4 MG/2ML IJ SOLN
INTRAMUSCULAR | Status: DC | PRN
  Administered 2019-04-28: 4 mg via INTRAVENOUS

## 2019-04-28 MED ORDER — CEFAZOLIN SODIUM-DEXTROSE 2-4 GM/100ML-% IV SOLN
INTRAVENOUS | Status: AC
  Filled 2019-04-28: qty 100

## 2019-04-28 MED ORDER — SODIUM CHLORIDE 0.9 % IV SOLN
INTRAVENOUS | Status: DC | PRN
  Administered 2019-04-28: 100 ug/min via INTRAVENOUS

## 2019-04-28 MED ORDER — LIDOCAINE 2% (20 MG/ML) 5 ML SYRINGE
INTRAMUSCULAR | Status: AC
  Filled 2019-04-28: qty 5

## 2019-04-28 SURGICAL SUPPLY — 123 items
APPLIER CLIP 11 MED OPEN (CLIP) ×10
BIT DRILL 2.8 QR W/DEPTH MARKS (BIT) ×10 IMPLANT
BIT DRILL AO MATTA 2.5MX230M (BIT) IMPLANT
BIT DRILL SCALD PELVS II 2.5MM (BIT) ×8 IMPLANT
BLADE SURG 10 STRL SS (BLADE) ×10 IMPLANT
BNDG COHESIVE 4X5 TAN STRL (GAUZE/BANDAGES/DRESSINGS) ×10 IMPLANT
BNDG COHESIVE 6X5 TAN STRL LF (GAUZE/BANDAGES/DRESSINGS) ×30 IMPLANT
BNDG ELASTIC 3X5.8 VLCR STR LF (GAUZE/BANDAGES/DRESSINGS) ×10 IMPLANT
BNDG ELASTIC 4X5.8 VLCR STR LF (GAUZE/BANDAGES/DRESSINGS) ×10 IMPLANT
BNDG ELASTIC 6X10 VLCR STRL LF (GAUZE/BANDAGES/DRESSINGS) ×20 IMPLANT
BNDG GAUZE ELAST 4 BULKY (GAUZE/BANDAGES/DRESSINGS) ×30 IMPLANT
BRUSH FEMORAL CANAL (MISCELLANEOUS) IMPLANT
BRUSH SCRUB EZ PLAIN DRY (MISCELLANEOUS) ×40 IMPLANT
CANISTER WOUND CARE 500ML ATS (WOUND CARE) ×30 IMPLANT
CLEANER TIP ELECTROSURG 2X2 (MISCELLANEOUS) ×10 IMPLANT
CLIP APPLIE 11 MED OPEN (CLIP) ×8 IMPLANT
CONNECTOR Y ATS VAC SYSTEM (MISCELLANEOUS) ×10 IMPLANT
COVER SURGICAL LIGHT HANDLE (MISCELLANEOUS) ×10 IMPLANT
COVER WAND RF STERILE (DRAPES) IMPLANT
DRAPE C-ARM 42X72 X-RAY (DRAPES) ×20 IMPLANT
DRAPE C-ARM MINI (DRAPES) ×10 IMPLANT
DRAPE C-ARMOR (DRAPES) ×20 IMPLANT
DRAPE HALF SHEET 40X57 (DRAPES) ×20 IMPLANT
DRAPE IMP U-DRAPE 54X76 (DRAPES) ×10 IMPLANT
DRAPE INCISE IOBAN 66X45 STRL (DRAPES) ×10 IMPLANT
DRAPE ORTHO SPLIT 77X108 STRL (DRAPES) ×4
DRAPE SURG ORHT 6 SPLT 77X108 (DRAPES) ×16 IMPLANT
DRAPE U-SHAPE 47X51 STRL (DRAPES) ×10 IMPLANT
DRESSING VERAFLO CLEANSE CC (GAUZE/BANDAGES/DRESSINGS) ×8 IMPLANT
DRILL BIT AO MATTA 2.5MX230M (BIT)
DRILL SCALED PELVIS II 2.5MM (BIT) ×10
DRSG ADAPTIC 3X8 NADH LF (GAUZE/BANDAGES/DRESSINGS) IMPLANT
DRSG MEPILEX BORDER 4X8 (GAUZE/BANDAGES/DRESSINGS) ×10 IMPLANT
DRSG MEPITEL 4X7.2 (GAUZE/BANDAGES/DRESSINGS) ×30 IMPLANT
DRSG PAD ABDOMINAL 8X10 ST (GAUZE/BANDAGES/DRESSINGS) ×10 IMPLANT
DRSG VAC ATS LRG SENSATRAC (GAUZE/BANDAGES/DRESSINGS) ×30 IMPLANT
DRSG VERAFLO CLEANSE CC (GAUZE/BANDAGES/DRESSINGS) ×10
DRSG VERSA FOAM LRG 10X15 (GAUZE/BANDAGES/DRESSINGS) ×30 IMPLANT
ELECT BLADE 6.5 EXT (BLADE) IMPLANT
ELECT CAUTERY BLADE 6.4 (BLADE) IMPLANT
ELECT REM PT RETURN 9FT ADLT (ELECTROSURGICAL) ×10
ELECTRODE REM PT RTRN 9FT ADLT (ELECTROSURGICAL) ×8 IMPLANT
EVACUATOR 1/8 PVC DRAIN (DRAIN) ×10 IMPLANT
GAUZE SPONGE 4X4 12PLY STRL (GAUZE/BANDAGES/DRESSINGS) ×10 IMPLANT
GAUZE XEROFORM 5X9 LF (GAUZE/BANDAGES/DRESSINGS) IMPLANT
GLOVE BIO SURGEON STRL SZ 6.5 (GLOVE) ×9 IMPLANT
GLOVE BIO SURGEON STRL SZ7.5 (GLOVE) ×20 IMPLANT
GLOVE BIO SURGEON STRL SZ8 (GLOVE) ×30 IMPLANT
GLOVE BIO SURGEONS STRL SZ 6.5 (GLOVE) ×1
GLOVE BIOGEL PI IND STRL 6 (GLOVE) ×8 IMPLANT
GLOVE BIOGEL PI IND STRL 7.5 (GLOVE) ×8 IMPLANT
GLOVE BIOGEL PI IND STRL 8 (GLOVE) ×16 IMPLANT
GLOVE BIOGEL PI IND STRL 9 (GLOVE) ×8 IMPLANT
GLOVE BIOGEL PI INDICATOR 6 (GLOVE) ×2
GLOVE BIOGEL PI INDICATOR 7.5 (GLOVE) ×2
GLOVE BIOGEL PI INDICATOR 8 (GLOVE) ×4
GLOVE BIOGEL PI INDICATOR 9 (GLOVE) ×2
GOWN STRL REUS W/ TWL LRG LVL3 (GOWN DISPOSABLE) ×32 IMPLANT
GOWN STRL REUS W/ TWL XL LVL3 (GOWN DISPOSABLE) ×8 IMPLANT
GOWN STRL REUS W/TWL 2XL LVL3 (GOWN DISPOSABLE) ×10 IMPLANT
GOWN STRL REUS W/TWL LRG LVL3 (GOWN DISPOSABLE) ×8
GOWN STRL REUS W/TWL XL LVL3 (GOWN DISPOSABLE) ×2
HANDPIECE INTERPULSE COAX TIP (DISPOSABLE) ×4
KIT BASIN OR (CUSTOM PROCEDURE TRAY) ×10 IMPLANT
KIT STIMULAN RAPID CURE  10CC (Orthopedic Implant) ×2 IMPLANT
KIT STIMULAN RAPID CURE 10CC (Orthopedic Implant) ×8 IMPLANT
KIT TURNOVER KIT B (KITS) ×10 IMPLANT
MANIFOLD NEPTUNE II (INSTRUMENTS) ×10 IMPLANT
NEEDLE 22X1 1/2 (OR ONLY) (NEEDLE) IMPLANT
NS IRRIG 1000ML POUR BTL (IV SOLUTION) ×10 IMPLANT
PACK ORTHO EXTREMITY (CUSTOM PROCEDURE TRAY) IMPLANT
PACK TOTAL JOINT (CUSTOM PROCEDURE TRAY) ×10 IMPLANT
PACK UNIVERSAL I (CUSTOM PROCEDURE TRAY) ×10 IMPLANT
PAD ARMBOARD 7.5X6 YLW CONV (MISCELLANEOUS) ×20 IMPLANT
PAD NEG PRESSURE SENSATRAC (MISCELLANEOUS) ×10 IMPLANT
PADDING CAST COTTON 6X4 STRL (CAST SUPPLIES) IMPLANT
PENCIL BUTTON HOLSTER BLD 10FT (ELECTRODE) ×20 IMPLANT
PILLOW ABDUCTION MEDIUM (MISCELLANEOUS) IMPLANT
PLATE ACET STRT 94.5M 8H (Plate) ×10 IMPLANT
PLATE ULNA 10HOLE (Plate) ×10 IMPLANT
PLATE ULNAR 130 10H LCKG (Plate) ×8 IMPLANT
SCREW CORTEX ST MATTA 3.5X18MM (Screw) ×10 IMPLANT
SCREW CORTEX ST MATTA 3.5X20 (Screw) ×10 IMPLANT
SCREW CORTEX ST MATTA 3.5X26MM (Screw) ×10 IMPLANT
SCREW CORTEX ST MATTA 3.5X30MM (Screw) ×10 IMPLANT
SCREW CORTEX ST MATTA 3.5X38M (Screw) ×10 IMPLANT
SCREW CORTICAL 3.5X20MM (Screw) ×10 IMPLANT
SCREW HEXALOBE NON-LOCK 3.5X14 (Screw) ×10 IMPLANT
SCREW HEXALOBE NON-LOCK 3.5X16 (Screw) ×20 IMPLANT
SCREW NON LOCKING HEX 3.5X22 (Screw) ×10 IMPLANT
SCREW NON LOCKING HEX 3.5X24 (Screw) ×10 IMPLANT
SET HNDPC FAN SPRY TIP SCT (DISPOSABLE) ×16 IMPLANT
SLEEVE SURGEON STRL (DRAPES) ×10 IMPLANT
SOL PREP POV-IOD 4OZ 10% (MISCELLANEOUS) ×30 IMPLANT
SOL PREP PROV IODINE SCRUB 4OZ (MISCELLANEOUS) ×30 IMPLANT
SPONGE LAP 18X18 RF (DISPOSABLE) ×70 IMPLANT
STAPLER VISISTAT 35W (STAPLE) ×10 IMPLANT
STOCKINETTE IMPERVIOUS 9X36 MD (GAUZE/BANDAGES/DRESSINGS) IMPLANT
STOCKINETTE IMPERVIOUS LG (DRAPES) ×20 IMPLANT
SUT ETHILON 2 0 PSLX (SUTURE) ×40 IMPLANT
SUT ETHILON 3 0 FSL (SUTURE) ×20 IMPLANT
SUT PDS AB 0 CT 36 (SUTURE) ×10 IMPLANT
SUT PDS AB 1 CT  36 (SUTURE) ×2
SUT PDS AB 1 CT 36 (SUTURE) ×8 IMPLANT
SUT PDS AB 2-0 CT1 27 (SUTURE) ×20 IMPLANT
SUT SILK 2 0 PERMA HAND 18 BK (SUTURE) ×30 IMPLANT
SUT VIC AB 0 CT1 27 (SUTURE) ×4
SUT VIC AB 0 CT1 27XBRD ANBCTR (SUTURE) ×16 IMPLANT
SUT VIC AB 1 CT1 18XCR BRD 8 (SUTURE) ×8 IMPLANT
SUT VIC AB 1 CT1 27 (SUTURE)
SUT VIC AB 1 CT1 27XBRD ANBCTR (SUTURE) IMPLANT
SUT VIC AB 1 CT1 8-18 (SUTURE) ×2
SUT VIC AB 2-0 CT1 27 (SUTURE) ×4
SUT VIC AB 2-0 CT1 TAPERPNT 27 (SUTURE) ×16 IMPLANT
SYR CONTROL 10ML LL (SYRINGE) IMPLANT
TOWEL GREEN STERILE (TOWEL DISPOSABLE) ×20 IMPLANT
TOWEL GREEN STERILE FF (TOWEL DISPOSABLE) ×10 IMPLANT
TOWER CARTRIDGE SMART MIX (DISPOSABLE) IMPLANT
TRAY FOLEY MTR SLVR 16FR STAT (SET/KITS/TRAYS/PACK) IMPLANT
TUBE CONNECTING 12'X1/4 (SUCTIONS) ×2
TUBE CONNECTING 12X1/4 (SUCTIONS) ×18 IMPLANT
UNDERPAD 30X30 (UNDERPADS AND DIAPERS) ×30 IMPLANT
YANKAUER SUCT BULB TIP NO VENT (SUCTIONS) ×20 IMPLANT

## 2019-04-28 NOTE — Progress Notes (Signed)
Dr. Valma Cava notified of potassium. Per Dr. Valma Cava call him on patients arrival for orders.

## 2019-04-28 NOTE — Progress Notes (Signed)
On arrival to Short Stay patient placed on monitor. Noted to be in sinus tach other VS as noted. Dr. Valma Cava at bedside on arrival and aware of VS. Orders received to treat hyperkalemia.
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
## 2019-04-28 NOTE — Anesthesia Procedure Notes (Signed)
Arterial Line Insertion Start/End10/13/2020 1:55 PM, 04/28/2019 2:08 PM Performed by: Moshe Salisbury, CRNA  Patient location: OR. Preanesthetic checklist: patient identified, IV checked, site marked, risks and benefits discussed, surgical consent, monitors and equipment checked, pre-op evaluation, timeout performed and anesthesia consent Left, radial was placed Catheter size: 20 G Hand hygiene performed  and Seldinger technique used  Attempts: 1 Procedure performed using ultrasound guided technique. Ultrasound Notes:anatomy identified, needle tip was noted to be adjacent to the nerve/plexus identified, no ultrasound evidence of intravascular and/or intraneural injection and image(s) printed for medical record Following insertion, dressing applied and Biopatch. Post procedure assessment: normal  Patient tolerated the procedure well with no immediate complications.

## 2019-04-28 NOTE — Anesthesia Preprocedure Evaluation (Addendum)
Anesthesia Evaluation  Patient identified by MRN, date of birth, ID band Patient awake    Reviewed: Allergy & Precautions, H&P , NPO status , Patient's Chart, lab work & pertinent test results  Airway Mallampati: II  TM Distance: >3 FB Neck ROM: Full    Dental no notable dental hx. (+) Teeth Intact, Dental Advisory Given   Pulmonary neg pulmonary ROS,    Pulmonary exam normal breath sounds clear to auscultation       Cardiovascular Exercise Tolerance: Good negative cardio ROS Normal cardiovascular exam Rhythm:Regular Rate:Normal     Neuro/Psych negative neurological ROS  negative psych ROS   GI/Hepatic negative GI ROS, Neg liver ROS,   Endo/Other  negative endocrine ROS  Renal/GU ARFRenal diseaseK+ 5.7 Cr 1.92     Musculoskeletal negative musculoskeletal ROS (+)   Abdominal   Peds  Hematology negative hematology ROS (+) Hgb 16.4 Plt 135  T x C last transfused 10/12   Anesthesia Other Findings   Reproductive/Obstetrics negative OB ROS                            Anesthesia Physical Anesthesia Plan  ASA: III  Anesthesia Plan: General   Post-op Pain Management:    Induction: Intravenous  PONV Risk Score and Plan: 3 and Treatment may vary due to age or medical condition, Ondansetron and Dexamethasone  Airway Management Planned: Oral ETT  Additional Equipment: Arterial line and CVP  Intra-op Plan:   Post-operative Plan: Possible Post-op intubation/ventilation  Informed Consent: I have reviewed the patients History and Physical, chart, labs and discussed the procedure including the risks, benefits and alternatives for the proposed anesthesia with the patient or authorized representative who has indicated his/her understanding and acceptance.     Dental advisory given  Plan Discussed with: CRNA  Anesthesia Plan Comments: (CVP for access, A line for repeat ABGs to SICU post  op +/- intubation)      Anesthesia Quick Evaluation

## 2019-04-28 NOTE — Anesthesia Procedure Notes (Addendum)
Central Venous Catheter Insertion Performed by: Barnet Glasgow, MD, anesthesiologist Patient location: Pre-op. Preanesthetic checklist: patient identified, IV checked, site marked, risks and benefits discussed, surgical consent, monitors and equipment checked, pre-op evaluation, timeout performed and anesthesia consent Position: Trendelenburg Lidocaine 1% used for infiltration and patient sedated Hand hygiene performed  and maximum sterile barriers used  Catheter size: 8 Fr Total catheter length 16. Central line was placed.Double lumen Procedure performed using ultrasound guided technique. Ultrasound Notes:anatomy identified, needle tip was noted to be adjacent to the nerve/plexus identified and image(s) printed for medical record Attempts: 1 Following insertion, dressing applied, line sutured and Biopatch. Post procedure assessment: blood return through all ports  Patient tolerated the procedure well with no immediate complications.
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
## 2019-04-28 NOTE — Anesthesia Procedure Notes (Signed)
Procedure Name: Intubation Date/Time: 04/28/2019 9:49 AM Performed by: Moshe Salisbury, CRNA Pre-anesthesia Checklist: Patient identified, Emergency Drugs available, Suction available and Patient being monitored Patient Re-evaluated:Patient Re-evaluated prior to induction Oxygen Delivery Method: Circle System Utilized Preoxygenation: Pre-oxygenation with 100% oxygen Induction Type: IV induction Ventilation: Mask ventilation without difficulty Laryngoscope Size: Mac and 4 Grade View: Grade I Tube type: Oral Tube size: 7.5 mm Number of attempts: 1 Airway Equipment and Method: Stylet Placement Confirmation: ETT inserted through vocal cords under direct vision,  positive ETCO2 and breath sounds checked- equal and bilateral Secured at: 21 cm Tube secured with: Tape Dental Injury: Teeth and Oropharynx as per pre-operative assessment

## 2019-04-28 NOTE — Transfer of Care (Signed)
Immediate Anesthesia Transfer of Care Note  Patient: Joseph Stokes  Procedure(s) Performed: Irrigation and debridement of open wound right tibia, right heel, right lateral thigh wounds with removal of foreign body right heel. (Right Leg Lower) irrigation and debridement of tramatic  arthrotomy of left knee and quadricep tendon repair, irrigation and debridement of grade three posterior fibula fracture (Left Knee) Irrigation And Debridement Iscial fracture (N/A Perineum) Application Of Wound Vac, left  knee and posterier left calf, perineum, and  right thigh Open Reduction Internal Fixation (Orif) transverse acetabular  Fracture and placement of antibiotic beads (Right Pelvis) Open Reduction Internal Fixation open  (Orif) Ulnar Fracture and irigation and debridement (Right Arm Lower) Repair pelvic floor (Right Perineum)  Patient Location: PACU  Anesthesia Type:General  Level of Consciousness: awake and patient cooperative  Airway & Oxygen Therapy: Patient Spontanous Breathing and Patient connected to nasal cannula oxygen  Post-op Assessment: Report given to RN, Post -op Vital signs reviewed and stable and Patient moving all extremities  Post vital signs: Reviewed and stable  Last Vitals:  Vitals Value Taken Time  BP    Temp    Pulse    Resp    SpO2      Last Pain:  Vitals:   04/28/19 0800  TempSrc: Oral  PainSc: 0-No pain      Patients Stated Pain Goal: 0 (28/36/62 9476)  Complications: No apparent anesthesia complications
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
## 2019-04-29 ENCOUNTER — Encounter (HOSPITAL_COMMUNITY): Payer: Self-pay | Admitting: Orthopedic Surgery

## 2019-04-29 LAB — BASIC METABOLIC PANEL
Anion gap: 10 (ref 5–15)
BUN: 9 mg/dL (ref 6–20)
CO2: 21 mmol/L — ABNORMAL LOW (ref 22–32)
Calcium: 7.1 mg/dL — ABNORMAL LOW (ref 8.9–10.3)
Chloride: 107 mmol/L (ref 98–111)
Creatinine, Ser: 1.04 mg/dL (ref 0.61–1.24)
GFR calc Af Amer: >60 mL/min (ref >60)
GFR calc non Af Amer: >60 mL/min (ref >60)
Glucose, Bld: 119 mg/dL — ABNORMAL HIGH (ref 70–99)
Potassium: 4 mmol/L (ref 3.5–5.1)
Sodium: 138 mmol/L (ref 135–145)

## 2019-04-29 LAB — CBC
HCT: 27 % — ABNORMAL LOW (ref 39.0–52.0)
Hemoglobin: 9.5 g/dL — ABNORMAL LOW (ref 13.0–17.0)
MCH: 30.1 pg (ref 26.0–34.0)
MCHC: 35.2 g/dL (ref 30.0–36.0)
MCV: 85.4 fL (ref 80.0–100.0)
Platelets: 67 10*3/uL — ABNORMAL LOW (ref 150–400)
RBC: 3.16 MIL/uL — ABNORMAL LOW (ref 4.22–5.81)
RDW: 13.9 % (ref 11.5–15.5)
WBC: 12.4 10*3/uL — ABNORMAL HIGH (ref 4.0–10.5)
nRBC: 0 % (ref 0.0–0.2)

## 2019-04-29 LAB — PHOSPHORUS: Phosphorus: 2.5 mg/dL (ref 2.5–4.6)

## 2019-04-29 LAB — HEMOGLOBIN AND HEMATOCRIT, BLOOD
HCT: 24.5 % — ABNORMAL LOW (ref 39.0–52.0)
Hemoglobin: 8.6 g/dL — ABNORMAL LOW (ref 13.0–17.0)

## 2019-04-29 LAB — MAGNESIUM: Magnesium: 1.4 mg/dL — ABNORMAL LOW (ref 1.7–2.4)

## 2019-04-29 LAB — LACTIC ACID, PLASMA: Lactic Acid, Venous: 3.2 mmol/L (ref 0.5–1.9)

## 2019-04-29 LAB — CK: Total CK: 4685 U/L — ABNORMAL HIGH (ref 49–397)

## 2019-04-29 MED ORDER — ACETAMINOPHEN 500 MG PO TABS
1000.0000 mg | ORAL_TABLET | Freq: Four times a day (QID) | ORAL | Status: DC
  Administered 2019-04-29 – 2019-05-07 (×11): 1000 mg via ORAL
  Filled 2019-04-29 (×17): qty 2

## 2019-04-29 MED ORDER — ENOXAPARIN SODIUM 30 MG/0.3ML ~~LOC~~ SOLN
30.0000 mg | Freq: Two times a day (BID) | SUBCUTANEOUS | Status: DC
  Administered 2019-04-29 – 2019-05-07 (×14): 30 mg via SUBCUTANEOUS
  Filled 2019-04-29 (×15): qty 0.3

## 2019-04-29 MED ORDER — IBUPROFEN 200 MG PO TABS
600.0000 mg | ORAL_TABLET | Freq: Four times a day (QID) | ORAL | Status: AC | PRN
  Administered 2019-04-29 – 2019-05-01 (×3): 600 mg via ORAL
  Filled 2019-04-29: qty 3
  Filled 2019-04-29: qty 1
  Filled 2019-04-29: qty 3

## 2019-04-29 MED ORDER — MAGNESIUM SULFATE 4 GM/100ML IV SOLN
4.0000 g | Freq: Once | INTRAVENOUS | Status: AC
  Administered 2019-04-29: 4 g via INTRAVENOUS
  Filled 2019-04-29: qty 100

## 2019-04-29 MED ORDER — LACTATED RINGERS IV BOLUS
1000.0000 mL | Freq: Once | INTRAVENOUS | Status: AC
  Administered 2019-04-29: 1000 mL via INTRAVENOUS

## 2019-04-29 MED FILL — Chlorhexidine Gluconate Liquid 4%: CUTANEOUS | Qty: 472 | Status: AC
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
## 2019-04-29 NOTE — Anesthesia Postprocedure Evaluation (Signed)
Anesthesia Post Note  Patient: Joseph Stokes  Procedure(s) Performed: Irrigation and debridement of open wound right tibia, right heel, right lateral thigh wounds with removal of foreign body right heel. (Right Leg Lower) irrigation and debridement of tramatic  arthrotomy of left knee and quadricep tendon repair, irrigation and debridement of grade three posterior fibula fracture (Left Knee) Irrigation And Debridement Iscial fracture (N/A Perineum) Application Of Wound Vac, left  knee and posterier left calf, perineum, and  right thigh Open Reduction Internal Fixation (Orif) transverse acetabular  Fracture and placement of antibiotic beads (Right Pelvis) Open Reduction Internal Fixation open  (Orif) Ulnar Fracture and irigation and debridement (Right Arm Lower) Repair pelvic floor (Right Perineum)     Patient location during evaluation: PACU Anesthesia Type: General Level of consciousness: awake and alert Pain management: pain level controlled Vital Signs Assessment: post-procedure vital signs reviewed and stable Respiratory status: spontaneous breathing, nonlabored ventilation, respiratory function stable and patient connected to nasal cannula oxygen Cardiovascular status: blood pressure returned to baseline and stable Postop Assessment: no apparent nausea or vomiting Anesthetic complications: no    Last Vitals:  Vitals:   04/29/19 1000 04/29/19 1100  BP: 128/73   Pulse: 97 (!) 118  Resp: 17 16  Temp:    SpO2: 93% 93%    Last Pain:  Vitals:   04/29/19 1015  TempSrc:   PainSc: Asleep                 Jobin Montelongo COKER
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
## 2019-04-30 ENCOUNTER — Encounter (HOSPITAL_COMMUNITY): Admission: EM | Disposition: A | Discharge status: Rehabilitation Facility | Payer: Self-pay | Source: Home / Self Care

## 2019-04-30 ENCOUNTER — Inpatient Hospital Stay (HOSPITAL_COMMUNITY): Payer: Medicaid Other

## 2019-04-30 ENCOUNTER — Encounter (HOSPITAL_COMMUNITY): Payer: Self-pay | Admitting: *Deleted

## 2019-04-30 ENCOUNTER — Inpatient Hospital Stay (HOSPITAL_COMMUNITY): Payer: Medicaid Other | Admitting: Anesthesiology

## 2019-04-30 HISTORY — PX: I & D EXTREMITY: SHX5045

## 2019-04-30 HISTORY — PX: HIP ARTHROPLASTY: SHX981

## 2019-04-30 LAB — POCT I-STAT 7, (LYTES, BLD GAS, ICA,H+H)
Acid-base deficit: 11 mmol/L — ABNORMAL HIGH (ref 0.0–2.0)
Acid-base deficit: 8 mmol/L — ABNORMAL HIGH (ref 0.0–2.0)
Acid-base deficit: 7 mmol/L — ABNORMAL HIGH (ref 0.0–2.0)
Acid-base deficit: 8 mmol/L — ABNORMAL HIGH (ref 0.0–2.0)
Bicarbonate: 16 mmol/L — ABNORMAL LOW (ref 20.0–28.0)
Bicarbonate: 17.7 mmol/L — ABNORMAL LOW (ref 20.0–28.0)
Bicarbonate: 19.4 mmol/L — ABNORMAL LOW (ref 20.0–28.0)
Bicarbonate: 18.6 mmol/L — ABNORMAL LOW (ref 20.0–28.0)
Calcium, Ion: 0.99 mmol/L — ABNORMAL LOW (ref 1.15–1.40)
Calcium, Ion: 1 mmol/L — ABNORMAL LOW (ref 1.15–1.40)
Calcium, Ion: 1 mmol/L — ABNORMAL LOW (ref 1.15–1.40)
Calcium, Ion: 1 mmol/L — ABNORMAL LOW (ref 1.15–1.40)
HCT: 27 % — ABNORMAL LOW (ref 39.0–52.0)
HCT: 20 % — ABNORMAL LOW (ref 39.0–52.0)
HCT: 21 % — ABNORMAL LOW (ref 39.0–52.0)
HCT: 32 % — ABNORMAL LOW (ref 39.0–52.0)
Hemoglobin: 9.2 g/dL — ABNORMAL LOW (ref 13.0–17.0)
Hemoglobin: 6.8 g/dL — CL (ref 13.0–17.0)
Hemoglobin: 7.1 g/dL — ABNORMAL LOW (ref 13.0–17.0)
Hemoglobin: 10.9 g/dL — ABNORMAL LOW (ref 13.0–17.0)
O2 Saturation: 99 %
O2 Saturation: 99 %
O2 Saturation: 99 %
O2 Saturation: 99 %
Patient temperature: 36.3
Patient temperature: 36.2
Patient temperature: 36.8
Patient temperature: 36.3
Potassium: 4.4 mmol/L (ref 3.5–5.1)
Potassium: 5 mmol/L (ref 3.5–5.1)
Potassium: 5 mmol/L (ref 3.5–5.1)
Potassium: 4.7 mmol/L (ref 3.5–5.1)
Sodium: 143 mmol/L (ref 135–145)
Sodium: 141 mmol/L (ref 135–145)
Sodium: 141 mmol/L (ref 135–145)
Sodium: 143 mmol/L (ref 135–145)
TCO2: 17 mmol/L — ABNORMAL LOW (ref 22–32)
TCO2: 19 mmol/L — ABNORMAL LOW (ref 22–32)
TCO2: 21 mmol/L — ABNORMAL LOW (ref 22–32)
TCO2: 20 mmol/L — ABNORMAL LOW (ref 22–32)
pCO2 arterial: 37.5 mmHg (ref 32.0–48.0)
pCO2 arterial: 36.3 mmHg (ref 32.0–48.0)
pCO2 arterial: 39.4 mmHg (ref 32.0–48.0)
pCO2 arterial: 41.4 mmHg (ref 32.0–48.0)
pH, Arterial: 7.235 — ABNORMAL LOW (ref 7.350–7.450)
pH, Arterial: 7.291 — ABNORMAL LOW (ref 7.350–7.450)
pH, Arterial: 7.299 — ABNORMAL LOW (ref 7.350–7.450)
pH, Arterial: 7.257 — ABNORMAL LOW (ref 7.350–7.450)
pO2, Arterial: 169 mmHg — ABNORMAL HIGH (ref 83.0–108.0)
pO2, Arterial: 169 mmHg — ABNORMAL HIGH (ref 83.0–108.0)
pO2, Arterial: 172 mmHg — ABNORMAL HIGH (ref 83.0–108.0)
pO2, Arterial: 182 mmHg — ABNORMAL HIGH (ref 83.0–108.0)

## 2019-04-30 LAB — POCT I-STAT, CHEM 8
BUN: 4 mg/dL — ABNORMAL LOW (ref 6–20)
Calcium, Ion: 1.22 mmol/L (ref 1.15–1.40)
Chloride: 103 mmol/L (ref 98–111)
Creatinine, Ser: 0.7 mg/dL (ref 0.61–1.24)
Glucose, Bld: 97 mg/dL (ref 70–99)
HCT: 23 % — ABNORMAL LOW (ref 39.0–52.0)
Hemoglobin: 7.8 g/dL — ABNORMAL LOW (ref 13.0–17.0)
Potassium: 3.4 mmol/L — ABNORMAL LOW (ref 3.5–5.1)
Sodium: 141 mmol/L (ref 135–145)
TCO2: 23 mmol/L (ref 22–32)

## 2019-04-30 LAB — POCT I-STAT 4, (NA,K, GLUC, HGB,HCT)
Glucose, Bld: 137 mg/dL — ABNORMAL HIGH (ref 70–99)
HCT: 26 % — ABNORMAL LOW (ref 39.0–52.0)
Hemoglobin: 8.8 g/dL — ABNORMAL LOW (ref 13.0–17.0)
Potassium: 4.5 mmol/L (ref 3.5–5.1)
Sodium: 143 mmol/L (ref 135–145)

## 2019-04-30 LAB — PHOSPHORUS: Phosphorus: 2.1 mg/dL — ABNORMAL LOW (ref 2.5–4.6)

## 2019-04-30 SURGERY — HEMIARTHROPLASTY, HIP, DIRECT ANTERIOR APPROACH, FOR FRACTURE
Anesthesia: General | Site: Leg Lower | Laterality: Right

## 2019-04-30 MED ORDER — SODIUM CHLORIDE 0.9% FLUSH
10.0000 mL | INTRAVENOUS | Status: DC | PRN
  Administered 2019-05-02 – 2019-05-05 (×2): 10 mL
  Filled 2019-04-30 (×2): qty 40

## 2019-04-30 MED ORDER — LACTATED RINGERS IV SOLN
INTRAVENOUS | Status: DC | PRN
  Administered 2019-04-30 (×2): via INTRAVENOUS

## 2019-04-30 MED ORDER — FENTANYL CITRATE (PF) 100 MCG/2ML IJ SOLN
INTRAMUSCULAR | Status: DC | PRN
  Administered 2019-04-30 (×2): 50 ug via INTRAVENOUS
  Administered 2019-04-30: 100 ug via INTRAVENOUS
  Administered 2019-04-30: 50 ug via INTRAVENOUS
  Administered 2019-04-30: 100 ug via INTRAVENOUS

## 2019-04-30 MED ORDER — LIDOCAINE HCL (CARDIAC) PF 100 MG/5ML IV SOSY
PREFILLED_SYRINGE | INTRAVENOUS | Status: DC | PRN
  Administered 2019-04-30: 60 mg via INTRAVENOUS
  Administered 2019-04-30: 40 mg via INTRAVENOUS

## 2019-04-30 MED ORDER — MEPERIDINE HCL 25 MG/ML IJ SOLN
6.2500 mg | INTRAMUSCULAR | Status: DC | PRN
  Administered 2019-04-30 (×2): 6.25 mg via INTRAVENOUS

## 2019-04-30 MED ORDER — ONDANSETRON HCL 4 MG/2ML IJ SOLN
INTRAMUSCULAR | Status: AC
  Filled 2019-04-30: qty 2

## 2019-04-30 MED ORDER — PHENYLEPHRINE 40 MCG/ML (10ML) SYRINGE FOR IV PUSH (FOR BLOOD PRESSURE SUPPORT)
PREFILLED_SYRINGE | INTRAVENOUS | Status: AC
  Filled 2019-04-30: qty 10

## 2019-04-30 MED ORDER — ONDANSETRON HCL 4 MG/2ML IJ SOLN
INTRAMUSCULAR | Status: DC | PRN
  Administered 2019-04-30: 4 mg via INTRAVENOUS

## 2019-04-30 MED ORDER — ROCURONIUM BROMIDE 10 MG/ML (PF) SYRINGE
PREFILLED_SYRINGE | INTRAVENOUS | Status: AC
  Filled 2019-04-30: qty 20

## 2019-04-30 MED ORDER — FENTANYL CITRATE (PF) 100 MCG/2ML IJ SOLN
25.0000 ug | INTRAMUSCULAR | Status: DC | PRN
  Administered 2019-04-30 (×3): 50 ug via INTRAVENOUS

## 2019-04-30 MED ORDER — DEXAMETHASONE SODIUM PHOSPHATE 10 MG/ML IJ SOLN
INTRAMUSCULAR | Status: AC
  Filled 2019-04-30: qty 1

## 2019-04-30 MED ORDER — PROPOFOL 10 MG/ML IV BOLUS
INTRAVENOUS | Status: AC
  Filled 2019-04-30: qty 40

## 2019-04-30 MED ORDER — LACTATED RINGERS IV SOLN
INTRAVENOUS | Status: DC
  Administered 2019-05-04 – 2019-05-06 (×3): via INTRAVENOUS

## 2019-04-30 MED ORDER — PHENYLEPHRINE HCL (PRESSORS) 10 MG/ML IV SOLN
INTRAVENOUS | Status: DC | PRN
  Administered 2019-04-30: 160 ug via INTRAVENOUS
  Administered 2019-04-30 (×3): 80 ug via INTRAVENOUS

## 2019-04-30 MED ORDER — MIDAZOLAM HCL 2 MG/2ML IJ SOLN
INTRAMUSCULAR | Status: AC
  Filled 2019-04-30: qty 2

## 2019-04-30 MED ORDER — MIDAZOLAM HCL 5 MG/5ML IJ SOLN
INTRAMUSCULAR | Status: DC | PRN
  Administered 2019-04-30: 2 mg via INTRAVENOUS

## 2019-04-30 MED ORDER — SODIUM CHLORIDE 0.9 % IV SOLN: INTRAVENOUS | Status: DC | PRN

## 2019-04-30 MED ORDER — FENTANYL CITRATE (PF) 100 MCG/2ML IJ SOLN
INTRAMUSCULAR | Status: AC
  Administered 2019-04-30: 50 ug via INTRAVENOUS
  Filled 2019-04-30: qty 2

## 2019-04-30 MED ORDER — PROPOFOL 10 MG/ML IV BOLUS
INTRAVENOUS | Status: DC | PRN
  Administered 2019-04-30: 200 mg via INTRAVENOUS
  Administered 2019-04-30: 50 mg via INTRAVENOUS

## 2019-04-30 MED ORDER — FENTANYL CITRATE (PF) 250 MCG/5ML IJ SOLN
INTRAMUSCULAR | Status: AC
  Filled 2019-04-30: qty 5

## 2019-04-30 MED ORDER — CEFAZOLIN SODIUM 1 G IJ SOLR
INTRAMUSCULAR | Status: AC
  Filled 2019-04-30: qty 40

## 2019-04-30 MED ORDER — TOBRAMYCIN SULFATE 1.2 G IJ SOLR
INTRAMUSCULAR | Status: DC | PRN
  Administered 2019-04-30 (×2): 1.2 g

## 2019-04-30 MED ORDER — VANCOMYCIN HCL 1000 MG IV SOLR
INTRAVENOUS | Status: AC
  Filled 2019-04-30: qty 1000

## 2019-04-30 MED ORDER — VANCOMYCIN HCL 1000 MG IV SOLR
INTRAVENOUS | Status: DC | PRN
  Administered 2019-04-30 (×2): 1000 mg

## 2019-04-30 MED ORDER — ALBUMIN HUMAN 5 % IV SOLN
INTRAVENOUS | Status: DC | PRN
  Administered 2019-04-30: 10:00:00 via INTRAVENOUS

## 2019-04-30 MED ORDER — HYDROMORPHONE HCL 1 MG/ML IJ SOLN
0.2500 mg | INTRAMUSCULAR | Status: DC | PRN
  Administered 2019-04-30 (×2): 0.5 mg via INTRAVENOUS

## 2019-04-30 MED ORDER — MINERAL OIL LIGHT OIL
TOPICAL_OIL | Freq: Once | Status: DC
  Filled 2019-04-30 (×2): qty 10

## 2019-04-30 MED ORDER — CEFAZOLIN SODIUM-DEXTROSE 2-3 GM-%(50ML) IV SOLR
INTRAVENOUS | Status: DC | PRN
  Administered 2019-04-30 (×2): 2 g via INTRAVENOUS

## 2019-04-30 MED ORDER — SUGAMMADEX SODIUM 500 MG/5ML IV SOLN
INTRAVENOUS | Status: AC
  Filled 2019-04-30: qty 5

## 2019-04-30 MED ORDER — SUGAMMADEX SODIUM 200 MG/2ML IV SOLN
INTRAVENOUS | Status: DC | PRN
  Administered 2019-04-30: 200 mg via INTRAVENOUS

## 2019-04-30 MED ORDER — VANCOMYCIN HCL 1000 MG IV SOLR
INTRAVENOUS | Status: AC
  Filled 2019-04-30: qty 2000

## 2019-04-30 MED ORDER — LACTATED RINGERS IV SOLN
INTRAVENOUS | Status: DC | PRN
  Administered 2019-04-30: 09:00:00 via INTRAVENOUS

## 2019-04-30 MED ORDER — 0.9 % SODIUM CHLORIDE (POUR BTL) OPTIME
TOPICAL | Status: DC | PRN
  Administered 2019-04-30 (×5): 1000 mL

## 2019-04-30 MED ORDER — LIDOCAINE 2% (20 MG/ML) 5 ML SYRINGE
INTRAMUSCULAR | Status: AC
  Filled 2019-04-30: qty 5

## 2019-04-30 MED ORDER — MINERAL OIL LIGHT OIL
TOPICAL_OIL | Freq: Once | Status: AC
  Administered 2019-04-30: 1 via TOPICAL
  Filled 2019-04-30: qty 10

## 2019-04-30 MED ORDER — ONDANSETRON HCL 4 MG/2ML IJ SOLN: 4.0000 mg | Freq: Once | INTRAMUSCULAR | Status: DC | PRN

## 2019-04-30 MED ORDER — OXYCODONE HCL 5 MG PO TABS
10.0000 mg | ORAL_TABLET | ORAL | Status: DC | PRN
  Administered 2019-04-30: 20 mg via ORAL
  Administered 2019-05-01 (×2): 15 mg via ORAL
  Filled 2019-04-30: qty 3
  Filled 2019-04-30: qty 4
  Filled 2019-04-30: qty 3

## 2019-04-30 MED ORDER — ROCURONIUM BROMIDE 50 MG/5ML IV SOSY
PREFILLED_SYRINGE | INTRAVENOUS | Status: DC | PRN
  Administered 2019-04-30: 100 mg via INTRAVENOUS
  Administered 2019-04-30 (×2): 50 mg via INTRAVENOUS

## 2019-04-30 MED ORDER — HYDROMORPHONE HCL 1 MG/ML IJ SOLN
INTRAMUSCULAR | Status: AC
  Administered 2019-04-30: 0.5 mg via INTRAVENOUS
  Filled 2019-04-30: qty 1

## 2019-04-30 MED ORDER — TOBRAMYCIN SULFATE 1.2 G IJ SOLR
INTRAMUSCULAR | Status: AC
  Filled 2019-04-30: qty 2.4

## 2019-04-30 MED ORDER — MEPERIDINE HCL 25 MG/ML IJ SOLN
INTRAMUSCULAR | Status: AC
  Administered 2019-04-30: 6.25 mg via INTRAVENOUS
  Filled 2019-04-30: qty 1

## 2019-04-30 SURGICAL SUPPLY — 91 items
BLADE SAW SAG 73X25 THK (BLADE) ×1
BLADE SAW SGTL 73X25 THK (BLADE) ×3 IMPLANT
BNDG COHESIVE 4X5 TAN STRL (GAUZE/BANDAGES/DRESSINGS) ×8 IMPLANT
BNDG ELASTIC 4X5.8 VLCR STR LF (GAUZE/BANDAGES/DRESSINGS) ×8 IMPLANT
BNDG ELASTIC 6X5.8 VLCR STR LF (GAUZE/BANDAGES/DRESSINGS) ×4 IMPLANT
BNDG GAUZE ELAST 4 BULKY (GAUZE/BANDAGES/DRESSINGS) ×8 IMPLANT
BRUSH FEMORAL CANAL (MISCELLANEOUS) IMPLANT
BRUSH SCRUB EZ PLAIN DRY (MISCELLANEOUS) ×8 IMPLANT
CANISTER WOUNDNEG PRESSURE 500 (CANNISTER) ×12 IMPLANT
CEMENT BONE DEPUY (Cement) ×12 IMPLANT
CONNECTOR Y ATS VAC SYSTEM (MISCELLANEOUS) ×4 IMPLANT
COVER SURGICAL LIGHT HANDLE (MISCELLANEOUS) ×8 IMPLANT
COVER WAND RF STERILE (DRAPES) IMPLANT
DRAPE HALF SHEET 40X57 (DRAPES) ×8 IMPLANT
DRAPE INCISE IOBAN 85X60 (DRAPES) ×4 IMPLANT
DRAPE ORTHO SPLIT 77X108 STRL (DRAPES) ×4
DRAPE SURG ORHT 6 SPLT 77X108 (DRAPES) ×4 IMPLANT
DRAPE U-SHAPE 47X51 STRL (DRAPES) ×4 IMPLANT
DRSG ADAPTIC 3X8 NADH LF (GAUZE/BANDAGES/DRESSINGS) ×4 IMPLANT
DRSG MEPILEX BORDER 4X4 (GAUZE/BANDAGES/DRESSINGS) ×20 IMPLANT
DRSG MEPILEX BORDER 4X8 (GAUZE/BANDAGES/DRESSINGS) ×16 IMPLANT
DRSG MEPITEL 4X7.2 (GAUZE/BANDAGES/DRESSINGS) ×8 IMPLANT
DRSG VAC ATS MED SENSATRAC (GAUZE/BANDAGES/DRESSINGS) ×8 IMPLANT
DRSG VAC ATS SM SENSATRAC (GAUZE/BANDAGES/DRESSINGS) ×8 IMPLANT
DRSG VERSA FOAM LRG 10X15 (GAUZE/BANDAGES/DRESSINGS) ×4 IMPLANT
ELECT BLADE 6.5 EXT (BLADE) IMPLANT
ELECT CAUTERY BLADE 6.4 (BLADE) ×4 IMPLANT
ELECT REM PT RETURN 9FT ADLT (ELECTROSURGICAL) ×4
ELECTRODE REM PT RTRN 9FT ADLT (ELECTROSURGICAL) ×2 IMPLANT
GAUZE SPONGE 4X4 12PLY STRL (GAUZE/BANDAGES/DRESSINGS) ×4 IMPLANT
GLOVE BIO SURGEON STRL SZ7.5 (GLOVE) ×16 IMPLANT
GLOVE BIO SURGEON STRL SZ8 (GLOVE) ×4 IMPLANT
GLOVE BIOGEL PI IND STRL 7.5 (GLOVE) ×2 IMPLANT
GLOVE BIOGEL PI IND STRL 8 (GLOVE) ×4 IMPLANT
GLOVE BIOGEL PI IND STRL 9 (GLOVE) ×2 IMPLANT
GLOVE BIOGEL PI INDICATOR 7.5 (GLOVE) ×2
GLOVE BIOGEL PI INDICATOR 8 (GLOVE) ×4
GLOVE BIOGEL PI INDICATOR 9 (GLOVE) ×2
GOWN STRL REUS W/ TWL LRG LVL3 (GOWN DISPOSABLE) ×6 IMPLANT
GOWN STRL REUS W/ TWL XL LVL3 (GOWN DISPOSABLE) ×4 IMPLANT
GOWN STRL REUS W/TWL LRG LVL3 (GOWN DISPOSABLE) ×6
GOWN STRL REUS W/TWL XL LVL3 (GOWN DISPOSABLE) ×4
HANDPIECE INTERPULSE COAX TIP (DISPOSABLE) ×2
HEAD FEM STD 32X+1 STRL (Hips) ×4 IMPLANT
IMMOBILIZER KNEE 22 UNIV (SOFTGOODS) ×4 IMPLANT
KIT BASIN OR (CUSTOM PROCEDURE TRAY) ×4 IMPLANT
KIT TURNOVER KIT B (KITS) ×4 IMPLANT
LINER ACET CUP 42MMX32MM (Hips) ×8 IMPLANT
MANIFOLD NEPTUNE II (INSTRUMENTS) ×4 IMPLANT
NEEDLE 1/2 CIR MAYO (NEEDLE) IMPLANT
NS IRRIG 1000ML POUR BTL (IV SOLUTION) ×24 IMPLANT
PACK ORTHO EXTREMITY (CUSTOM PROCEDURE TRAY) IMPLANT
PACK TOTAL JOINT (CUSTOM PROCEDURE TRAY) ×4 IMPLANT
PAD ARMBOARD 7.5X6 YLW CONV (MISCELLANEOUS) ×8 IMPLANT
PAD CAST 4YDX4 CTTN HI CHSV (CAST SUPPLIES) ×4 IMPLANT
PAD NEG PRESSURE SENSATRAC (MISCELLANEOUS) ×4 IMPLANT
PADDING CAST COTTON 4X4 STRL (CAST SUPPLIES) ×4
PADDING CAST COTTON 6X4 STRL (CAST SUPPLIES) ×4 IMPLANT
PENCIL BUTTON HOLSTER BLD 10FT (ELECTRODE) ×4 IMPLANT
PILLOW ABDUCTION MEDIUM (MISCELLANEOUS) IMPLANT
PRESSURIZER FEMORAL UNIV (MISCELLANEOUS) IMPLANT
RETRIEVER SUT HEWSON (MISCELLANEOUS) ×4 IMPLANT
SET HNDPC FAN SPRY TIP SCT (DISPOSABLE) ×2 IMPLANT
SOL PREP POV-IOD 4OZ 10% (MISCELLANEOUS) ×12 IMPLANT
SOL PREP PROV IODINE SCRUB 4OZ (MISCELLANEOUS) ×12 IMPLANT
SPONGE LAP 18X18 RF (DISPOSABLE) ×4 IMPLANT
STAPLER VISISTAT 35W (STAPLE) ×4 IMPLANT
STEM HIP OFFSET 105MM SZ1 (Stem) ×4 IMPLANT
STOCKINETTE IMPERVIOUS 9X36 MD (GAUZE/BANDAGES/DRESSINGS) ×8 IMPLANT
SUCTION FRAZIER TIP 10 FR DISP (SUCTIONS) ×4 IMPLANT
SUT ETHILON 2 0 PSLX (SUTURE) ×16 IMPLANT
SUT FIBERWIRE #2 38 T-5 BLUE (SUTURE) ×4
SUT MNCRL AB 4-0 PS2 18 (SUTURE) ×4 IMPLANT
SUT PDS AB 2-0 CT1 27 (SUTURE) ×12 IMPLANT
SUT VIC AB 0 CT1 27 (SUTURE) ×2
SUT VIC AB 0 CT1 27XBRD ANBCTR (SUTURE) ×2 IMPLANT
SUT VIC AB 1 CT1 18XCR BRD 8 (SUTURE) ×2 IMPLANT
SUT VIC AB 1 CT1 27 (SUTURE) ×4
SUT VIC AB 1 CT1 27XBRD ANBCTR (SUTURE) ×4 IMPLANT
SUT VIC AB 1 CT1 8-18 (SUTURE) ×2
SUT VIC AB 2-0 CT1 27 (SUTURE) ×4
SUT VIC AB 2-0 CT1 TAPERPNT 27 (SUTURE) ×4 IMPLANT
SUTURE FIBERWR #2 38 T-5 BLUE (SUTURE) ×2 IMPLANT
TOWEL GREEN STERILE (TOWEL DISPOSABLE) ×16 IMPLANT
TOWEL GREEN STERILE FF (TOWEL DISPOSABLE) ×4 IMPLANT
TOWER CARTRIDGE SMART MIX (DISPOSABLE) ×4 IMPLANT
TUBE CONNECTING 12'X1/4 (SUCTIONS) ×1
TUBE CONNECTING 12X1/4 (SUCTIONS) ×3 IMPLANT
UNDERPAD 30X30 (UNDERPADS AND DIAPERS) ×12 IMPLANT
WATER STERILE IRR 1000ML POUR (IV SOLUTION) ×4 IMPLANT
YANKAUER SUCT BULB TIP NO VENT (SUCTIONS) ×4 IMPLANT
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
## 2019-04-30 NOTE — Anesthesia Procedure Notes (Signed)
Procedure Name: Intubation Date/Time: 04/30/2019 8:53 AM Performed by: Scheryl Darter, CRNA Pre-anesthesia Checklist: Patient identified, Emergency Drugs available, Suction available and Patient being monitored Patient Re-evaluated:Patient Re-evaluated prior to induction Oxygen Delivery Method: Circle System Utilized Preoxygenation: Pre-oxygenation with 100% oxygen Induction Type: IV induction Ventilation: Mask ventilation without difficulty Laryngoscope Size: Miller and 3 Grade View: Grade I Tube type: Oral Tube size: 7.5 mm Number of attempts: 1 Airway Equipment and Method: Stylet and Oral airway Placement Confirmation: ETT inserted through vocal cords under direct vision,  positive ETCO2 and breath sounds checked- equal and bilateral Secured at: 23 cm Tube secured with: Tape Dental Injury: Teeth and Oropharynx as per pre-operative assessment
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
## 2019-04-30 NOTE — Transfer of Care (Signed)
Immediate Anesthesia Transfer of Care Note  Patient: Joseph Stokes  Procedure(s) Performed: HIP ARTHROTOMY PLACEMENT OF PROSTALAC SPACER AND WOUND VAC CHANGE TO GROIN AND RIGHT THIGH (Right Hip) IRRIGATION AND DEBRIDEMENT LEFT LEG WITH WOUND VAC CHANGE (Left Leg Lower)  Patient Location: PACU  Anesthesia Type:General  Level of Consciousness: awake, alert  and oriented  Airway & Oxygen Therapy: Patient Spontanous Breathing  Post-op Assessment: Report given to RN and Post -op Vital signs reviewed and stable  Post vital signs: Reviewed and stable  Last Vitals:  Vitals Value Taken Time  BP 142/83 04/30/19 1556  Temp    Pulse 117 04/30/19 1600  Resp 21 04/30/19 1600  SpO2 100 % 04/30/19 1600  Vitals shown include unvalidated device data.  Last Pain:  Vitals:   04/30/19 0640  TempSrc: Oral  PainSc:       Patients Stated Pain Goal: 4 (17/49/44 9675)  Complications: No apparent anesthesia complications

## 2019-04-30 NOTE — Progress Notes (Signed)
Arrived to Draw labs from [facility]. Pt was in hall leaving for OR. Primary RN aware.

## 2019-05-01 ENCOUNTER — Encounter (HOSPITAL_COMMUNITY): Payer: Self-pay

## 2019-05-01 LAB — BPAM RBC
Blood Product Expiration Date: 202011102359
Blood Product Expiration Date: 202011102359
Blood Product Expiration Date: 202011162359
Blood Product Expiration Date: 202011172359
ISSUE DATE / TIME: 202010131406
ISSUE DATE / TIME: 202010131406
ISSUE DATE / TIME: 202010150933
ISSUE DATE / TIME: 202010150933
Unit Type and Rh: 7300
Unit Type and Rh: 7300
Unit Type and Rh: 7300
Unit Type and Rh: 7300

## 2019-05-01 LAB — PHOSPHORUS: Phosphorus: 2.3 mg/dL — ABNORMAL LOW (ref 2.5–4.6)

## 2019-05-01 LAB — TYPE AND SCREEN
ABO/RH(D): B POS
Antibody Screen: NEGATIVE
Unit division: 0
Unit division: 0
Unit division: 0
Unit division: 0

## 2019-05-01 LAB — CBC
HCT: 24.3 % — ABNORMAL LOW (ref 39.0–52.0)
Hemoglobin: 8.7 g/dL — ABNORMAL LOW (ref 13.0–17.0)
MCH: 31.4 pg (ref 26.0–34.0)
MCHC: 35.8 g/dL (ref 30.0–36.0)
MCV: 87.7 fL (ref 80.0–100.0)
Platelets: 90 10*3/uL — ABNORMAL LOW (ref 150–400)
RBC: 2.77 MIL/uL — ABNORMAL LOW (ref 4.22–5.81)
RDW: 13.6 % (ref 11.5–15.5)
WBC: 12.3 10*3/uL — ABNORMAL HIGH (ref 4.0–10.5)
nRBC: 0.2 % (ref 0.0–0.2)

## 2019-05-01 LAB — POCT I-STAT 4, (NA,K, GLUC, HGB,HCT)
Glucose, Bld: 103 mg/dL — ABNORMAL HIGH (ref 70–99)
Glucose, Bld: 111 mg/dL — ABNORMAL HIGH (ref 70–99)
HCT: 21 % — ABNORMAL LOW (ref 39.0–52.0)
HCT: 24 % — ABNORMAL LOW (ref 39.0–52.0)
Hemoglobin: 7.1 g/dL — ABNORMAL LOW (ref 13.0–17.0)
Hemoglobin: 8.2 g/dL — ABNORMAL LOW (ref 13.0–17.0)
Potassium: 3.6 mmol/L (ref 3.5–5.1)
Potassium: 3.6 mmol/L (ref 3.5–5.1)
Sodium: 142 mmol/L (ref 135–145)
Sodium: 140 mmol/L (ref 135–145)

## 2019-05-01 LAB — BASIC METABOLIC PANEL
Anion gap: 6 (ref 5–15)
BUN: <5 mg/dL — ABNORMAL LOW (ref 6–20)
CO2: 25 mmol/L (ref 22–32)
Calcium: 7.8 mg/dL — ABNORMAL LOW (ref 8.9–10.3)
Chloride: 106 mmol/L (ref 98–111)
Creatinine, Ser: 0.89 mg/dL (ref 0.61–1.24)
Glucose, Bld: 110 mg/dL — ABNORMAL HIGH (ref 70–99)
Potassium: 3.3 mmol/L — ABNORMAL LOW (ref 3.5–5.1)
Sodium: 137 mmol/L (ref 135–145)

## 2019-05-01 LAB — MAGNESIUM: Magnesium: 1.9 mg/dL (ref 1.7–2.4)

## 2019-05-01 MED ORDER — OXYCODONE HCL 5 MG PO TABS
10.0000 mg | ORAL_TABLET | ORAL | Status: DC | PRN
  Administered 2019-05-01 – 2019-05-03 (×9): 20 mg via ORAL
  Administered 2019-05-04 – 2019-05-05 (×3): 10 mg via ORAL
  Administered 2019-05-05 – 2019-05-06 (×4): 20 mg via ORAL
  Administered 2019-05-06: 10 mg via ORAL
  Administered 2019-05-06: 20 mg via ORAL
  Administered 2019-05-07 (×2): 10 mg via ORAL
  Filled 2019-05-01 (×8): qty 4
  Filled 2019-05-01: qty 2
  Filled 2019-05-01 (×4): qty 4
  Filled 2019-05-01: qty 2
  Filled 2019-05-01 (×2): qty 4
  Filled 2019-05-01 (×2): qty 2
  Filled 2019-05-01 (×3): qty 4

## 2019-05-01 MED ORDER — K PHOS MONO-SOD PHOS DI & MONO 155-852-130 MG PO TABS
500.0000 mg | ORAL_TABLET | Freq: Two times a day (BID) | ORAL | Status: AC
  Administered 2019-05-01 (×2): 500 mg via ORAL
  Filled 2019-05-01 (×2): qty 2

## 2019-05-01 MED ORDER — POLYETHYLENE GLYCOL 3350 17 G PO PACK
17.0000 g | PACK | Freq: Every day | ORAL | Status: DC
  Administered 2019-05-01: 17 g via ORAL
  Filled 2019-05-01: qty 1

## 2019-05-01 MED ORDER — TAB-A-VITE/IRON PO TABS
1.0000 | ORAL_TABLET | Freq: Every day | ORAL | Status: DC
  Administered 2019-05-01 – 2019-05-07 (×6): 1 via ORAL
  Filled 2019-05-01 (×8): qty 1

## 2019-05-01 MED ORDER — DOCUSATE SODIUM 100 MG PO CAPS
100.0000 mg | ORAL_CAPSULE | Freq: Two times a day (BID) | ORAL | Status: DC
  Administered 2019-05-01 – 2019-05-07 (×12): 100 mg via ORAL
  Filled 2019-05-01 (×12): qty 1

## 2019-05-01 MED ORDER — LORAZEPAM 1 MG PO TABS
1.0000 mg | ORAL_TABLET | Freq: Three times a day (TID) | ORAL | Status: DC | PRN
  Administered 2019-05-01 – 2019-05-06 (×10): 1 mg via ORAL
  Filled 2019-05-01 (×11): qty 1

## 2019-05-01 MED ORDER — HYDROMORPHONE HCL 1 MG/ML IJ SOLN
1.0000 mg | INTRAMUSCULAR | Status: DC | PRN
  Administered 2019-05-01 – 2019-05-04 (×12): 1 mg via INTRAVENOUS
  Administered 2019-05-04 (×2): 0.5 mg via INTRAVENOUS
  Administered 2019-05-05 – 2019-05-06 (×3): 1 mg via INTRAVENOUS
  Filled 2019-05-01 (×15): qty 1

## 2019-05-01 NOTE — Plan of Care (Addendum)
Pt is very anxious and pressing call button every few minutes. Requesting staff at bedside to have someone talk to him until he falls asleep. Consistently asking if he needs blood or oxygen, if IV in neck has dislodged, and if everything is plugged in correctly. Prefers lights on and door opened.  All wound dressing changes were completed per order and noted.    Problem: Education: Goal: Knowledge of General Education information will improve Description: Including pain rating scale, medication(s)/side effects and non-pharmacologic comfort measures Outcome: Progressing   Problem: Clinical Measurements: Goal: Will remain free from infection Outcome: Progressing   Problem: Activity: Goal: Risk for activity intolerance will decrease Outcome: Progressing   Problem: Nutrition: Goal: Adequate nutrition will be maintained Outcome: Progressing   Problem: Coping: Goal: Level of anxiety will decrease Outcome: Progressing   Problem: Elimination: Goal: Will not experience complications related to urinary retention Outcome: Progressing   Problem: Pain Managment: Goal: General experience of comfort will improve Outcome: Progressing   Problem: Safety: Goal: Ability to remain free from injury will improve Outcome: Progressing

## 2019-05-01 NOTE — Progress Notes (Signed)
Went to see patient and he was asleep.  Nurse shared he had not been able to sleep and had been very anxious. It was good he was able to sleep.  I will come by again to see if he is awake and available before I leave at 5. Conard Novak, Chaplain   05/01/19 1400  Clinical Encounter Type  Visited With Patient not available
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
## 2019-05-01 NOTE — Progress Notes (Signed)
Orthopedic Tech Progress Note Patient Details:  Joseph Stokes [birth year] [id number] Called in order to [facility] for a [place]  Patient ID: [name], male   DOB: 1991/01/05, 28 y.o.   MRN: [id number]   Janit Pagan 05/01/2019, 8:34 AM
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
## 2019-05-01 NOTE — Anesthesia Postprocedure Evaluation (Signed)
Anesthesia Post Note  Patient: Joseph Stokes  Procedure(s) Performed: HIP ARTHROTOMY PLACEMENT OF PROSTALAC SPACER AND WOUND VAC CHANGE TO GROIN AND RIGHT THIGH (Right Hip) IRRIGATION AND DEBRIDEMENT LEFT LEG WITH WOUND VAC CHANGE (Left Leg Lower)     Patient location during evaluation: Other Anesthesia Type: General Level of consciousness: awake and alert Pain management: pain level controlled Vital Signs Assessment: post-procedure vital signs reviewed and stable Respiratory status: spontaneous breathing, nonlabored ventilation and respiratory function stable Cardiovascular status: blood pressure returned to baseline and stable Postop Assessment: no apparent nausea or vomiting Anesthetic complications: no    Last Vitals:  Vitals:   05/01/19 0041 05/01/19 0352  BP: 125/78 118/80  Pulse: (!) 107 (!) 105  Resp: 17 17  Temp: 36.9 C 37.1 C  SpO2: 99% 100%    Last Pain:  Vitals:   05/01/19 0616  TempSrc:   PainSc: 6                  Lorilei Horan,W. EDMOND
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
## 2019-05-02 LAB — CBC
HCT: 29.6 % — ABNORMAL LOW (ref 39.0–52.0)
HCT: 25.6 % — ABNORMAL LOW (ref 39.0–52.0)
Hemoglobin: 10.5 g/dL — ABNORMAL LOW (ref 13.0–17.0)
Hemoglobin: 9.1 g/dL — ABNORMAL LOW (ref 13.0–17.0)
MCH: 30.8 pg (ref 26.0–34.0)
MCH: 31.5 pg (ref 26.0–34.0)
MCHC: 35.5 g/dL (ref 30.0–36.0)
MCHC: 35.5 g/dL (ref 30.0–36.0)
MCV: 86.8 fL (ref 80.0–100.0)
MCV: 88.6 fL (ref 80.0–100.0)
Platelets: 86 10*3/uL — ABNORMAL LOW (ref 150–400)
Platelets: 143 10*3/uL — ABNORMAL LOW (ref 150–400)
RBC: 3.41 MIL/uL — ABNORMAL LOW (ref 4.22–5.81)
RBC: 2.89 MIL/uL — ABNORMAL LOW (ref 4.22–5.81)
RDW: 13.7 % (ref 11.5–15.5)
RDW: 13.4 % (ref 11.5–15.5)
WBC: 11.7 10*3/uL — ABNORMAL HIGH (ref 4.0–10.5)
WBC: 12.7 10*3/uL — ABNORMAL HIGH (ref 4.0–10.5)
nRBC: 0 % (ref 0.0–0.2)
nRBC: 0.6 % — ABNORMAL HIGH (ref 0.0–0.2)

## 2019-05-02 LAB — BASIC METABOLIC PANEL
Anion gap: 10 (ref 5–15)
BUN: <5 mg/dL — ABNORMAL LOW (ref 6–20)
CO2: 27 mmol/L (ref 22–32)
Calcium: 8 mg/dL — ABNORMAL LOW (ref 8.9–10.3)
Chloride: 101 mmol/L (ref 98–111)
Creatinine, Ser: 0.81 mg/dL (ref 0.61–1.24)
GFR calc Af Amer: >60 mL/min (ref >60)
GFR calc non Af Amer: >60 mL/min (ref >60)
Glucose, Bld: 117 mg/dL — ABNORMAL HIGH (ref 70–99)
Potassium: 3 mmol/L — ABNORMAL LOW (ref 3.5–5.1)
Sodium: 138 mmol/L (ref 135–145)

## 2019-05-02 MED ORDER — POTASSIUM CHLORIDE CRYS ER 20 MEQ PO TBCR
40.0000 meq | EXTENDED_RELEASE_TABLET | Freq: Two times a day (BID) | ORAL | Status: AC
  Administered 2019-05-02 – 2019-05-03 (×4): 40 meq via ORAL
  Filled 2019-05-02 (×4): qty 2

## 2019-05-02 MED ORDER — POLYETHYLENE GLYCOL 3350 17 G PO PACK
17.0000 g | PACK | Freq: Two times a day (BID) | ORAL | Status: AC
  Administered 2019-05-02 – 2019-05-03 (×3): 17 g via ORAL
  Filled 2019-05-02 (×3): qty 1

## 2019-05-02 MED ORDER — TRAZODONE HCL 50 MG PO TABS
50.0000 mg | ORAL_TABLET | Freq: Every day | ORAL | Status: DC
  Administered 2019-05-02 – 2019-05-06 (×5): 50 mg via ORAL
  Filled 2019-05-02 (×5): qty 1

## 2019-05-02 NOTE — Progress Notes (Signed)
Orthopedic Tech Progress Note Patient Details:  Joseph Stokes 12-03-1990 [id number]  Patient ID: Joseph Stokes, male   DOB: 17-May-1991, 28 y.o.   MRN: [id number]   Joseph Stokes 05/02/2019, 7:54 AMCalled [employer] for right resting hand splint.
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
## 2019-05-02 NOTE — Progress Notes (Signed)
Pt anxiety level remains high despite the ativan- continues to request aide for numerous issues. Back dressing and pad underneath had to be changed at 2100 due to saturation. Oxygen sats- 93-94% on room air pt states he feels like he  needed more so placed the oxygen at 2L [place]. Called chaplain to speak with him to help ease his anxiety because we are not able to spendas much time with him as he seems to require

## 2019-05-02 NOTE — Progress Notes (Signed)
Visited patient as a referral from previous Armed forces logistics/support/administrative officer. Patient is in the early stages of grief concerning loss of life of significant other. Suggest follow up visits and will continue to provide spiritual care.  Rev. Elroy.

## 2019-05-02 NOTE — Progress Notes (Signed)
Rehab Admissions Coordinator Note:  Patient was screened by Cleatrice Burke for appropriateness for an Inpatient Acute Rehab Consult per PT and OT recs. .  At this time, we are recommending Inpatient Rehab consult. Please place order for consult.  Cleatrice Burke RN MSN 05/02/2019, 3:36 PM  I can be reached at [phone].
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
## 2019-05-02 NOTE — Progress Notes (Signed)
The chaplain visited with the patient to follow-up on the last visit.  The patient is still very anxious and briefly talked about previous hospital stays.  The patient is still not resting, even though he told the chaplain that he gets "Cat Naps" here and there.  The chaplain will follow-up but will also give the patient time to rest.  [place] Resident For questions concerning this note please contact me by pager [phone]

## 2019-05-02 NOTE — Progress Notes (Signed)
Back dressing changed again due to saturation- rt LE wound vac canister changed due to full

## 2019-05-02 NOTE — Progress Notes (Signed)
Pt probably not slept more than 20-30 minutes- the whole night
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
## 2019-05-02 NOTE — Progress Notes (Signed)
The patient was still feeling anxious and unable to rest.  The chaplin visit seemed to help calm the patient as the patient was falling asleep while the chaplain was in the room.  The chaplain plans to pass the patient on to the unit chaplain for continued care.  Brion Aliment Chaplain Resident For questions concerning this note please contact me by pager [phone]

## 2019-05-03 ENCOUNTER — Encounter (HOSPITAL_COMMUNITY): Payer: Self-pay | Admitting: *Deleted

## 2019-05-03 MED ORDER — DEXTROSE-NACL 5-0.9 % IV SOLN
INTRAVENOUS | Status: DC
  Administered 2019-05-04: 01:00:00 via INTRAVENOUS

## 2019-05-03 NOTE — Progress Notes (Signed)
Chaplain followed-up with the patient and the patient seemed a lot less anxious and rested.  The chaplain will still visit with the patient but the patient appears to be dealing with the stress of his healing better today.  Brion Aliment Chaplain Resident For questions concerning this note please contact me by pager [phone]

## 2019-05-03 NOTE — Plan of Care (Signed)
  Problem: Safety: Goal: Ability to remain free from injury will improve Outcome: Progressing   Problem: Skin Integrity: Goal: Risk for impaired skin integrity will decrease Outcome: Progressing   
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
## 2019-05-03 NOTE — Progress Notes (Signed)
Despite starting trazadone 50mg  still has not slept much and has been on call light frequently- even have set times with him about a hour apert in which staff will be back in

## 2019-05-04 ENCOUNTER — Encounter (HOSPITAL_COMMUNITY): Payer: Self-pay | Admitting: *Deleted

## 2019-05-04 ENCOUNTER — Inpatient Hospital Stay (HOSPITAL_COMMUNITY): Payer: Medicaid Other | Admitting: Anesthesiology

## 2019-05-04 ENCOUNTER — Encounter (HOSPITAL_COMMUNITY): Admission: EM | Disposition: A | Discharge status: Rehabilitation Facility | Payer: Self-pay | Source: Home / Self Care

## 2019-05-04 HISTORY — PX: I & D EXTREMITY: SHX5045

## 2019-05-04 HISTORY — PX: INCISION AND DRAINAGE OF WOUND: SHX1803

## 2019-05-04 HISTORY — PX: SKIN SPLIT GRAFT: SHX444

## 2019-05-04 LAB — CBC
HCT: 27.8 % — ABNORMAL LOW (ref 39.0–52.0)
Hemoglobin: 9.5 g/dL — ABNORMAL LOW (ref 13.0–17.0)
MCH: 31.9 pg (ref 26.0–34.0)
MCHC: 34.2 g/dL (ref 30.0–36.0)
MCV: 93.3 fL (ref 80.0–100.0)
Platelets: 221 10*3/uL (ref 150–400)
RBC: 2.98 MIL/uL — ABNORMAL LOW (ref 4.22–5.81)
RDW: 14.7 % (ref 11.5–15.5)
WBC: 12.5 10*3/uL — ABNORMAL HIGH (ref 4.0–10.5)
nRBC: 0.4 % — ABNORMAL HIGH (ref 0.0–0.2)

## 2019-05-04 LAB — BASIC METABOLIC PANEL
Anion gap: 7 (ref 5–15)
BUN: <5 mg/dL — ABNORMAL LOW (ref 6–20)
CO2: 24 mmol/L (ref 22–32)
Calcium: 8.2 mg/dL — ABNORMAL LOW (ref 8.9–10.3)
Chloride: 108 mmol/L (ref 98–111)
Creatinine, Ser: 0.83 mg/dL (ref 0.61–1.24)
GFR calc Af Amer: >60 mL/min (ref >60)
GFR calc non Af Amer: >60 mL/min (ref >60)
Glucose, Bld: 114 mg/dL — ABNORMAL HIGH (ref 70–99)
Potassium: 4 mmol/L (ref 3.5–5.1)
Sodium: 139 mmol/L (ref 135–145)

## 2019-05-04 LAB — MAGNESIUM: Magnesium: 1.9 mg/dL (ref 1.7–2.4)

## 2019-05-04 LAB — PHOSPHORUS: Phosphorus: 3.6 mg/dL (ref 2.5–4.6)

## 2019-05-04 SURGERY — APPLICATION, GRAFT, SKIN, SPLIT-THICKNESS
Anesthesia: General | Site: Leg Lower | Laterality: Right

## 2019-05-04 MED ORDER — MIDAZOLAM HCL 2 MG/2ML IJ SOLN
INTRAMUSCULAR | Status: AC
  Filled 2019-05-04: qty 2

## 2019-05-04 MED ORDER — 0.9 % SODIUM CHLORIDE (POUR BTL) OPTIME
TOPICAL | Status: DC | PRN
  Administered 2019-05-04 (×3): 1000 mL

## 2019-05-04 MED ORDER — FENTANYL CITRATE (PF) 250 MCG/5ML IJ SOLN
INTRAMUSCULAR | Status: AC
  Filled 2019-05-04: qty 5

## 2019-05-04 MED ORDER — HYDROMORPHONE HCL 1 MG/ML IJ SOLN: 0.2500 mg | INTRAMUSCULAR | Status: DC | PRN

## 2019-05-04 MED ORDER — HYDROMORPHONE HCL 1 MG/ML IJ SOLN
INTRAMUSCULAR | Status: AC
  Filled 2019-05-04: qty 1

## 2019-05-04 MED ORDER — ROCURONIUM BROMIDE 50 MG/5ML IV SOSY
PREFILLED_SYRINGE | INTRAVENOUS | Status: DC | PRN
  Administered 2019-05-04: 30 mg via INTRAVENOUS

## 2019-05-04 MED ORDER — MIDAZOLAM HCL 5 MG/5ML IJ SOLN
INTRAMUSCULAR | Status: DC | PRN
  Administered 2019-05-04 (×2): 1 mg via INTRAVENOUS

## 2019-05-04 MED ORDER — PROMETHAZINE HCL 25 MG/ML IJ SOLN: 6.2500 mg | INTRAMUSCULAR | Status: DC | PRN

## 2019-05-04 MED ORDER — MINERAL OIL LIGHT 100 % EX OIL
TOPICAL_OIL | CUTANEOUS | Status: DC | PRN
  Administered 2019-05-04: 1 via TOPICAL

## 2019-05-04 MED ORDER — PROPOFOL 10 MG/ML IV BOLUS
INTRAVENOUS | Status: AC
  Filled 2019-05-04: qty 40

## 2019-05-04 MED ORDER — CHLORHEXIDINE GLUCONATE CLOTH 2 % EX PADS: 6.0000 | MEDICATED_PAD | Freq: Every day | CUTANEOUS | Status: DC

## 2019-05-04 MED ORDER — SUCCINYLCHOLINE CHLORIDE 20 MG/ML IJ SOLN
INTRAMUSCULAR | Status: DC | PRN
  Administered 2019-05-04: 100 mg via INTRAVENOUS

## 2019-05-04 MED ORDER — CHLORHEXIDINE GLUCONATE CLOTH 2 % EX PADS
6.0000 | MEDICATED_PAD | Freq: Every day | CUTANEOUS | Status: DC
  Administered 2019-05-05 – 2019-05-07 (×3): 6 via TOPICAL

## 2019-05-04 MED ORDER — BUPIVACAINE-EPINEPHRINE 0.5% -1:200000 IJ SOLN
INTRAMUSCULAR | Status: AC
  Filled 2019-05-04: qty 1

## 2019-05-04 MED ORDER — FENTANYL CITRATE (PF) 250 MCG/5ML IJ SOLN
INTRAMUSCULAR | Status: DC | PRN
  Administered 2019-05-04 (×5): 50 ug via INTRAVENOUS

## 2019-05-04 MED ORDER — ALBUMIN HUMAN 5 % IV SOLN
INTRAVENOUS | Status: DC | PRN
  Administered 2019-05-04: 10:00:00 via INTRAVENOUS

## 2019-05-04 MED ORDER — HYDROMORPHONE HCL 1 MG/ML IJ SOLN
INTRAMUSCULAR | Status: AC
  Administered 2019-05-04: 0.5 mg via INTRAVENOUS
  Filled 2019-05-04: qty 1

## 2019-05-04 MED ORDER — MINERAL OIL LIGHT OIL
TOPICAL_OIL | Status: DC
  Filled 2019-05-04: qty 10

## 2019-05-04 MED ORDER — DEXAMETHASONE SODIUM PHOSPHATE 10 MG/ML IJ SOLN
INTRAMUSCULAR | Status: DC | PRN
  Administered 2019-05-04: 5 mg via INTRAVENOUS

## 2019-05-04 MED ORDER — METRONIDAZOLE IN NACL 500-0.74 MG/100ML-% IV SOLN
500.0000 mg | INTRAVENOUS | Status: AC
  Administered 2019-05-04: 500 mg via INTRAVENOUS
  Filled 2019-05-04: qty 100

## 2019-05-04 MED ORDER — PROPOFOL 10 MG/ML IV BOLUS
INTRAVENOUS | Status: DC | PRN
  Administered 2019-05-04: 200 mg via INTRAVENOUS

## 2019-05-04 MED ORDER — BUPIVACAINE-EPINEPHRINE (PF) 0.5% -1:200000 IJ SOLN
INTRAMUSCULAR | Status: DC | PRN
  Administered 2019-05-04: 50 mL

## 2019-05-04 MED ORDER — HYDROMORPHONE HCL 1 MG/ML IJ SOLN
0.2500 mg | INTRAMUSCULAR | Status: DC | PRN
  Administered 2019-05-04 (×2): 0.5 mg via INTRAVENOUS

## 2019-05-04 SURGICAL SUPPLY — 77 items
BLADE CLIPPER SURG (BLADE) IMPLANT
BLADE DERMATOME SS (BLADE) ×4 IMPLANT
BNDG COHESIVE 4X5 TAN STRL (GAUZE/BANDAGES/DRESSINGS) ×4 IMPLANT
BNDG ELASTIC 4X5.8 VLCR STR LF (GAUZE/BANDAGES/DRESSINGS) ×4 IMPLANT
BNDG ELASTIC 6X10 VLCR STRL LF (GAUZE/BANDAGES/DRESSINGS) ×8 IMPLANT
BNDG ELASTIC 6X5.8 VLCR STR LF (GAUZE/BANDAGES/DRESSINGS) IMPLANT
BNDG GAUZE ELAST 4 BULKY (GAUZE/BANDAGES/DRESSINGS) ×8 IMPLANT
BRUSH SCRUB EZ PLAIN DRY (MISCELLANEOUS) ×8 IMPLANT
CANISTER SUCT 3000ML PPV (MISCELLANEOUS) IMPLANT
CANISTER WOUNDNEG PRESSURE 500 (CANNISTER) ×4 IMPLANT
COTTONBALL LRG STERILE PKG (GAUZE/BANDAGES/DRESSINGS) ×8 IMPLANT
COVER SURGICAL LIGHT HANDLE (MISCELLANEOUS) ×8 IMPLANT
COVER WAND RF STERILE (DRAPES) ×4 IMPLANT
DEPRESSOR TONGUE BLADE STERILE (MISCELLANEOUS) ×4 IMPLANT
DERMACARRIERS GRAFT 1 TO 1.5 (DISPOSABLE) ×4
DRAIN PENROSE 1/4X12 LTX STRL (WOUND CARE) ×4 IMPLANT
DRAPE HALF SHEET 40X57 (DRAPES) ×8 IMPLANT
DRAPE ORTHO SPLIT 77X108 STRL (DRAPES) ×2
DRAPE SURG ORHT 6 SPLT 77X108 (DRAPES) ×2 IMPLANT
DRAPE U-SHAPE 47X51 STRL (DRAPES) ×4 IMPLANT
DRSG ADAPTIC 3X8 NADH LF (GAUZE/BANDAGES/DRESSINGS) ×8 IMPLANT
DRSG AQUACEL AG ADV 3.5X 6 (GAUZE/BANDAGES/DRESSINGS) ×4 IMPLANT
DRSG MEPILEX BORDER 4X4 (GAUZE/BANDAGES/DRESSINGS) ×4 IMPLANT
DRSG MEPITEL 4X7.2 (GAUZE/BANDAGES/DRESSINGS) ×8 IMPLANT
DRSG PAD ABDOMINAL 8X10 ST (GAUZE/BANDAGES/DRESSINGS) ×20 IMPLANT
DRSG VAC ATS MED SENSATRAC (GAUZE/BANDAGES/DRESSINGS) ×4 IMPLANT
ELECT REM PT RETURN 9FT ADLT (ELECTROSURGICAL)
ELECTRODE REM PT RTRN 9FT ADLT (ELECTROSURGICAL) IMPLANT
GAUZE 4X4 16PLY RFD (DISPOSABLE) ×4 IMPLANT
GAUZE SPONGE 4X4 12PLY STRL (GAUZE/BANDAGES/DRESSINGS) IMPLANT
GAUZE SPONGE 4X4 12PLY STRL LF (GAUZE/BANDAGES/DRESSINGS) ×4 IMPLANT
GAUZE XEROFORM 5X9 LF (GAUZE/BANDAGES/DRESSINGS) ×4 IMPLANT
GLOVE BIO SURGEON STRL SZ7.5 (GLOVE) ×16 IMPLANT
GLOVE BIO SURGEON STRL SZ8 (GLOVE) ×4 IMPLANT
GLOVE BIOGEL PI IND STRL 7.5 (GLOVE) ×4 IMPLANT
GLOVE BIOGEL PI IND STRL 8 (GLOVE) ×2 IMPLANT
GLOVE BIOGEL PI IND STRL 9 (GLOVE) ×2 IMPLANT
GLOVE BIOGEL PI INDICATOR 7.5 (GLOVE) ×4
GLOVE BIOGEL PI INDICATOR 8 (GLOVE) ×2
GLOVE BIOGEL PI INDICATOR 9 (GLOVE) ×2
GOWN STRL REUS W/ TWL LRG LVL3 (GOWN DISPOSABLE) ×10 IMPLANT
GOWN STRL REUS W/ TWL XL LVL3 (GOWN DISPOSABLE) ×6 IMPLANT
GOWN STRL REUS W/TWL LRG LVL3 (GOWN DISPOSABLE) ×10
GOWN STRL REUS W/TWL XL LVL3 (GOWN DISPOSABLE) ×6
GRAFT DERMACARRIERS 1 TO 1.5 (DISPOSABLE) ×2 IMPLANT
HANDPIECE INTERPULSE COAX TIP (DISPOSABLE)
KIT BASIN OR (CUSTOM PROCEDURE TRAY) ×4 IMPLANT
KIT TURNOVER KIT B (KITS) ×4 IMPLANT
MANIFOLD NEPTUNE II (INSTRUMENTS) ×4 IMPLANT
NS IRRIG 1000ML POUR BTL (IV SOLUTION) ×4 IMPLANT
PACK GENERAL/GYN (CUSTOM PROCEDURE TRAY) ×4 IMPLANT
PACK ORTHO EXTREMITY (CUSTOM PROCEDURE TRAY) ×4 IMPLANT
PAD ARMBOARD 7.5X6 YLW CONV (MISCELLANEOUS) ×8 IMPLANT
PAD CAST 4YDX4 CTTN HI CHSV (CAST SUPPLIES) ×2 IMPLANT
PADDING CAST COTTON 4X4 STRL (CAST SUPPLIES) ×2
PADDING CAST COTTON 6X4 STRL (CAST SUPPLIES) IMPLANT
SET HNDPC FAN SPRY TIP SCT (DISPOSABLE) IMPLANT
SOL PREP POV-IOD 4OZ 10% (MISCELLANEOUS) ×8 IMPLANT
SOL PREP PROV IODINE SCRUB 4OZ (MISCELLANEOUS) ×8 IMPLANT
SPONGE LAP 18X18 RF (DISPOSABLE) ×4 IMPLANT
STAPLER VISISTAT (STAPLE) IMPLANT
STAPLER VISISTAT 35W (STAPLE) ×4 IMPLANT
STOCKINETTE 4X48 STRL (DRAPES) ×4 IMPLANT
STOCKINETTE IMPERVIOUS 9X36 MD (GAUZE/BANDAGES/DRESSINGS) IMPLANT
STOCKINETTE IMPERVIOUS LG (DRAPES) IMPLANT
SUT ETHILON 2 0 PSLX (SUTURE) ×16 IMPLANT
SUT ETHILON 3 0 FSL (SUTURE) IMPLANT
SUT PDS AB 0 CT1 27 (SUTURE) ×8 IMPLANT
SUT PDS AB 2-0 CT1 27 (SUTURE) ×12 IMPLANT
SYR BULB IRRIGATION 50ML (SYRINGE) ×4 IMPLANT
TOWEL GREEN STERILE (TOWEL DISPOSABLE) ×8 IMPLANT
TOWEL GREEN STERILE FF (TOWEL DISPOSABLE) ×4 IMPLANT
TUBE CONNECTING 12'X1/4 (SUCTIONS) ×3
TUBE CONNECTING 12X1/4 (SUCTIONS) ×9 IMPLANT
UNDERPAD 30X30 (UNDERPADS AND DIAPERS) ×4 IMPLANT
WATER STERILE IRR 1000ML POUR (IV SOLUTION) ×4 IMPLANT
YANKAUER SUCT BULB TIP NO VENT (SUCTIONS) ×12 IMPLANT
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
## 2019-05-04 NOTE — Anesthesia Procedure Notes (Signed)
Procedure Name: Intubation Date/Time: 05/04/2019 8:44 AM Performed by: Glynda Jaeger, CRNA Pre-anesthesia Checklist: Patient identified, Patient being monitored, Timeout performed, Emergency Drugs available and Suction available Patient Re-evaluated:Patient Re-evaluated prior to induction Oxygen Delivery Method: Circle System Utilized Preoxygenation: Pre-oxygenation with 100% oxygen Induction Type: IV induction Laryngoscope Size: Mac and 4 Grade View: Grade I Tube type: Oral Tube size: 7.5 mm Number of attempts: 1 Airway Equipment and Method: Stylet Placement Confirmation: ETT inserted through vocal cords under direct vision,  positive ETCO2 and breath sounds checked- equal and bilateral Secured at: 21 cm Tube secured with: Tape Dental Injury: Teeth and Oropharynx as per pre-operative assessment  Comments: RSI
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
## 2019-05-04 NOTE — Transfer of Care (Signed)
Immediate Anesthesia Transfer of Care Note  Patient: Joseph Stokes  Procedure(s) Performed: SKIN GRAFT SPLIT THICKNESS (Left Leg Lower) IRRIGATION AND DEBRIDEMENT EXTREMITY (Left Leg Lower) Irrigation And Debridement Wound (Right Groin)  Patient Location: PACU  Anesthesia Type:General  Level of Consciousness: awake, alert  and oriented  Airway & Oxygen Therapy: Patient Spontanous Breathing  Post-op Assessment: Report given to RN and Post -op Vital signs reviewed and stable  Post vital signs: Reviewed and stable  Last Vitals:  Vitals Value Taken Time  BP    Temp    Pulse 123 05/04/19 1127  Resp    SpO2 100 % 05/04/19 1127  Vitals shown include unvalidated device data.  Last Pain:  Vitals:   05/04/19 0547  TempSrc:   PainSc: Asleep      Patients Stated Pain Goal: 3 (10/03/20 4825)  Complications: No apparent anesthesia complications
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
## 2019-05-04 NOTE — Progress Notes (Signed)
RN reported to [facility] in anesthesia.   Patient has left the floor transporting to OR.   Telemetry notified that patient has left the floor for surgery.   Patient remained NPO at midnight and throughout the night.

## 2019-05-04 NOTE — Plan of Care (Signed)
  Problem: Clinical Measurements: Goal: Respiratory complications will improve Outcome: Progressing   Problem: Coping: Goal: Level of anxiety will decrease Outcome: Progressing   Problem: Elimination: Goal: Will not experience complications related to bowel motility Outcome: Progressing Goal: Will not experience complications related to urinary retention Outcome: Progressing   Problem: Safety: Goal: Ability to remain free from injury will improve Outcome: Progressing   Problem: Skin Integrity: Goal: Risk for impaired skin integrity will decrease Outcome: Progressing
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
## 2019-05-05 ENCOUNTER — Encounter (HOSPITAL_COMMUNITY): Payer: Self-pay | Admitting: Orthopedic Surgery

## 2019-05-05 LAB — CBC
HCT: 27.9 % — ABNORMAL LOW (ref 39.0–52.0)
Hemoglobin: 9.3 g/dL — ABNORMAL LOW (ref 13.0–17.0)
MCH: 31.4 pg (ref 26.0–34.0)
MCHC: 33.3 g/dL (ref 30.0–36.0)
MCV: 94.3 fL (ref 80.0–100.0)
Platelets: 298 10*3/uL (ref 150–400)
RBC: 2.96 MIL/uL — ABNORMAL LOW (ref 4.22–5.81)
RDW: 15.7 % — ABNORMAL HIGH (ref 11.5–15.5)
WBC: 12.5 10*3/uL — ABNORMAL HIGH (ref 4.0–10.5)
nRBC: 0.3 % — ABNORMAL HIGH (ref 0.0–0.2)

## 2019-05-05 LAB — BASIC METABOLIC PANEL
Anion gap: 7 (ref 5–15)
BUN: 5 mg/dL — ABNORMAL LOW (ref 6–20)
CO2: 25 mmol/L (ref 22–32)
Calcium: 8.3 mg/dL — ABNORMAL LOW (ref 8.9–10.3)
Chloride: 107 mmol/L (ref 98–111)
Creatinine, Ser: 0.85 mg/dL (ref 0.61–1.24)
GFR calc Af Amer: >60 mL/min (ref >60)
GFR calc non Af Amer: >60 mL/min (ref >60)
Glucose, Bld: 109 mg/dL — ABNORMAL HIGH (ref 70–99)
Potassium: 3.9 mmol/L (ref 3.5–5.1)
Sodium: 139 mmol/L (ref 135–145)

## 2019-05-05 LAB — MAGNESIUM: Magnesium: 2 mg/dL (ref 1.7–2.4)

## 2019-05-05 LAB — PHOSPHORUS: Phosphorus: 4.5 mg/dL (ref 2.5–4.6)

## 2019-05-05 MED ORDER — SODIUM CHLORIDE 0.9 % IV SOLN
2.0000 g | INTRAVENOUS | Status: AC
  Administered 2019-05-05: 2 g via INTRAVENOUS
  Filled 2019-05-05: qty 20

## 2019-05-05 MED ORDER — METRONIDAZOLE IN NACL 5-0.79 MG/ML-% IV SOLN
500.0000 mg | Freq: Three times a day (TID) | INTRAVENOUS | Status: AC
  Administered 2019-05-05 – 2019-05-06 (×3): 500 mg via INTRAVENOUS
  Filled 2019-05-05 (×3): qty 100

## 2019-05-05 NOTE — Progress Notes (Signed)
Per RN, patient has been up in chair for about 3 hours earlier, now in bed out of knee immobilizer and MD does not want him back in brace for a little while to allow donor graft site to dry. RN reports he is OK for bed exercises but please hold on OOB this afternoon, can check back tomorrow for OOB activities.  Deniece Ree PT, DPT, CBIS  Supplemental Physical Therapist [facility]    Pager [phone] Acute Rehab Office [phone]
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
## 2019-05-05 NOTE — Plan of Care (Signed)
  Problem: Education: Goal: Knowledge of General Education information will improve Description: Including pain rating scale, medication(s)/side effects and non-pharmacologic comfort measures Outcome: Progressing   Problem: Health Behavior/Discharge Planning: Goal: Ability to manage health-related needs will improve Outcome: Progressing   Problem: Clinical Measurements: Goal: Ability to maintain clinical measurements within normal limits will improve Outcome: Progressing   Problem: Education: Goal: Knowledge of General Education information will improve Description: Including pain rating scale, medication(s)/side effects and non-pharmacologic comfort measures 05/05/2019 0024 by Guinevere Scarlet, RN Outcome: Progressing 05/05/2019 0024 by Guinevere Scarlet, RN Outcome: Progressing   Problem: Health Behavior/Discharge Planning: Goal: Ability to manage health-related needs will improve 05/05/2019 0024 by Guinevere Scarlet, RN Outcome: Progressing 05/05/2019 0024 by Guinevere Scarlet, RN Outcome: Progressing   Problem: Clinical Measurements: Goal: Ability to maintain clinical measurements within normal limits will improve Outcome: Progressing
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
## 2019-05-05 NOTE — Progress Notes (Signed)
Responded to call from [place] requesting spiritual care and support because he was finally getting in touch with the death of his girlfriend.  Having established a relationship with Neythan yesterday, he was able to set his emotions free and grieve for the loss of his girl and his unborn child.  He quieted after a while.  He asked for assistance in being repositioned in the bed. Two assistants came to help him.He was joking with them as I left.  Chaplains will continue to respond to him as needed.  De Burrs Chaplain Resident Pager:  [phone]

## 2019-05-05 NOTE — Anesthesia Postprocedure Evaluation (Signed)
Anesthesia Post Note  Patient: Joseph Stokes  Procedure(s) Performed: SKIN GRAFT SPLIT THICKNESS (Left Leg Lower) IRRIGATION AND DEBRIDEMENT EXTREMITY (Left Leg Lower) Irrigation And Debridement Wound (Right Groin)     Patient location during evaluation: PACU Anesthesia Type: General Level of consciousness: awake and alert Pain management: pain level controlled Vital Signs Assessment: post-procedure vital signs reviewed and stable Respiratory status: spontaneous breathing, nonlabored ventilation, respiratory function stable and patient connected to nasal cannula oxygen Cardiovascular status: blood pressure returned to baseline and stable Postop Assessment: no apparent nausea or vomiting Anesthetic complications: no    Last Vitals:  Vitals:   05/05/19 0754 05/05/19 1223  BP: 138/67 (!) 155/75  Pulse: 88 (!) 106  Resp: 18 18  Temp: 37 C 37.2 C  SpO2: 100% 100%    Last Pain:  Vitals:   05/05/19 1813  TempSrc:   PainSc: 10-Worst pain ever                 Tiajuana Amass

## 2019-05-05 NOTE — Plan of Care (Signed)
  Problem: Activity: Goal: Risk for activity intolerance will decrease Outcome: Progressing   Problem: Nutrition: Goal: Adequate nutrition will be maintained Outcome: Progressing   Problem: Elimination: Goal: Will not experience complications related to urinary retention Outcome: Progressing   Problem: Pain Managment: Goal: General experience of comfort will improve Outcome: Progressing   Problem: Safety: Goal: Ability to remain free from injury will improve Outcome: Progressing   
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
## 2019-05-06 LAB — BASIC METABOLIC PANEL
Anion gap: 10 (ref 5–15)
BUN: <5 mg/dL — ABNORMAL LOW (ref 6–20)
CO2: 25 mmol/L (ref 22–32)
Calcium: 8.5 mg/dL — ABNORMAL LOW (ref 8.9–10.3)
Chloride: 102 mmol/L (ref 98–111)
Creatinine, Ser: 0.71 mg/dL (ref 0.61–1.24)
GFR calc Af Amer: >60 mL/min (ref >60)
GFR calc non Af Amer: >60 mL/min (ref >60)
Glucose, Bld: 96 mg/dL (ref 70–99)
Potassium: 3.5 mmol/L (ref 3.5–5.1)
Sodium: 137 mmol/L (ref 135–145)

## 2019-05-06 LAB — CBC
HCT: 29 % — ABNORMAL LOW (ref 39.0–52.0)
Hemoglobin: 9.7 g/dL — ABNORMAL LOW (ref 13.0–17.0)
MCH: 31.6 pg (ref 26.0–34.0)
MCHC: 33.4 g/dL (ref 30.0–36.0)
MCV: 94.5 fL (ref 80.0–100.0)
Platelets: 354 10*3/uL (ref 150–400)
RBC: 3.07 MIL/uL — ABNORMAL LOW (ref 4.22–5.81)
RDW: 15.9 % — ABNORMAL HIGH (ref 11.5–15.5)
WBC: 14.9 10*3/uL — ABNORMAL HIGH (ref 4.0–10.5)
nRBC: 0.2 % (ref 0.0–0.2)

## 2019-05-06 LAB — PHOSPHORUS: Phosphorus: 4.4 mg/dL (ref 2.5–4.6)

## 2019-05-06 LAB — MAGNESIUM: Magnesium: 1.8 mg/dL (ref 1.7–2.4)

## 2019-05-06 MED ORDER — METHOCARBAMOL 500 MG PO TABS
1000.0000 mg | ORAL_TABLET | Freq: Three times a day (TID) | ORAL | Status: DC
  Administered 2019-05-06 – 2019-05-07 (×3): 1000 mg via ORAL
  Filled 2019-05-06 (×3): qty 2

## 2019-05-06 MED ORDER — MAGNESIUM SULFATE 2 GM/50ML IV SOLN
2.0000 g | Freq: Once | INTRAVENOUS | Status: AC
  Administered 2019-05-06: 2 g via INTRAVENOUS
  Filled 2019-05-06: qty 50

## 2019-05-06 MED ORDER — TRAMADOL HCL 50 MG PO TABS
50.0000 mg | ORAL_TABLET | Freq: Four times a day (QID) | ORAL | Status: DC
  Administered 2019-05-06 – 2019-05-07 (×5): 50 mg via ORAL
  Filled 2019-05-06 (×5): qty 1

## 2019-05-06 NOTE — Progress Notes (Signed)
Responded to spiritual care consult to support patient. Patient alert and feeling ok and talked about possible being discharged in a few days.  Patient still grieving the lost of his girlfriend. Just needed to talk and someone to listen.  Provided empathic listening, emotional and grief support, ministry of presence and prayer.  Chaplain available as needed.    Jaclynn Major, [place], [facility], Pager [phone]
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
## 2019-05-06 NOTE — Plan of Care (Signed)
  Problem: Clinical Measurements: Goal: Will remain free from infection 05/06/2019 1328 by Stevan Born, RN Outcome: Progressing 05/06/2019 1328 by Stevan Born, RN Outcome: Progressing Goal: Cardiovascular complication will be avoided 05/06/2019 1328 by Neveyah Garzon, Curley Spice, RN Outcome: Progressing 05/06/2019 1328 by Stevan Born, RN Outcome: Progressing   Problem: Coping: Goal: Level of anxiety will decrease 05/06/2019 1328 by Stevan Born, RN Outcome: Progressing 05/06/2019 1328 by Stevan Born, RN Outcome: Progressing   Problem: Pain Managment: Goal: General experience of comfort will improve 05/06/2019 1328 by Stevan Born, RN Outcome: Progressing 05/06/2019 1328 by Stevan Born, RN Outcome: Progressing   Problem: Safety: Goal: Ability to remain free from injury will improve 05/06/2019 1328 by Dorien Mayotte, Curley Spice, RN Outcome: Progressing 05/06/2019 1328 by Stevan Born, RN Outcome: Progressing   Problem: Skin Integrity: Goal: Risk for impaired skin integrity will decrease 05/06/2019 1328 by Stevan Born, RN Outcome: Progressing 05/06/2019 1328 by Stevan Born, RN Outcome: Progressing
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
## 2019-05-06 NOTE — Progress Notes (Signed)
EKG completed in paper chart.  HR 107, patient resting comfortably in chair
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
## 2019-05-06 NOTE — Plan of Care (Signed)

## 2019-05-06 NOTE — Progress Notes (Signed)
Follow-up spiritual care.  Patient on phone; nurse taking vitals.  Sat on couch and waited for considerable time for phone call to end.  Excused myself as nurse still attempting to get vitals and patient still on phone.  Will check back in with patient at a later time.  De Burrs Chaplain Resident Pager:  [phone]
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
## 2019-05-06 NOTE — Progress Notes (Signed)
Inpatient Rehabilitation-Admissions Coordinator   I have confirmed DC support and plan with pt's mother at this time. Per Ortho note on 10/20, pt may be ready for DC Thursday. Will follow for medical readiness.   Jhonnie Garner, OTR/L  Rehab Admissions Coordinator  [phone] 05/06/2019 2:02 PM
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
## 2019-05-07 ENCOUNTER — Inpatient Hospital Stay (HOSPITAL_COMMUNITY)
Admission: RE | Admit: 2019-05-07 | Discharge: 2019-05-20 | DRG: 560 | Disposition: A | Discharge status: Home / Self Care | Payer: Medicaid Other | Source: Intra-hospital | Attending: Physical Medicine & Rehabilitation | Admitting: Physical Medicine & Rehabilitation

## 2019-05-07 ENCOUNTER — Encounter (HOSPITAL_COMMUNITY): Payer: Self-pay | Admitting: Physical Medicine and Rehabilitation

## 2019-05-07 ENCOUNTER — Encounter (HOSPITAL_BASED_OUTPATIENT_CLINIC_OR_DEPARTMENT_OTHER): Payer: Self-pay | Admitting: *Deleted

## 2019-05-07 DIAGNOSIS — W3400XA Accidental discharge from unspecified firearms or gun, initial encounter: Secondary | ICD-10-CM

## 2019-05-07 DIAGNOSIS — Z560 Unemployment, unspecified: Secondary | ICD-10-CM | POA: Diagnosis not present

## 2019-05-07 DIAGNOSIS — S31030D Puncture wound without foreign body of lower back and pelvis without penetration into retroperitoneum, subsequent encounter: Secondary | ICD-10-CM

## 2019-05-07 DIAGNOSIS — T148XXA Other injury of unspecified body region, initial encounter: Secondary | ICD-10-CM

## 2019-05-07 DIAGNOSIS — R7401 Elevation of levels of liver transaminase levels: Secondary | ICD-10-CM | POA: Diagnosis present

## 2019-05-07 DIAGNOSIS — F411 Generalized anxiety disorder: Secondary | ICD-10-CM

## 2019-05-07 DIAGNOSIS — G8918 Other acute postprocedural pain: Secondary | ICD-10-CM

## 2019-05-07 DIAGNOSIS — S32810D Multiple fractures of pelvis with stable disruption of pelvic ring, subsequent encounter for fracture with routine healing: Principal | ICD-10-CM

## 2019-05-07 DIAGNOSIS — Z23 Encounter for immunization: Secondary | ICD-10-CM | POA: Diagnosis not present

## 2019-05-07 DIAGNOSIS — Z6827 Body mass index (BMI) 27.0-27.9, adult: Secondary | ICD-10-CM | POA: Diagnosis not present

## 2019-05-07 DIAGNOSIS — T07XXXA Unspecified multiple injuries, initial encounter: Secondary | ICD-10-CM | POA: Diagnosis not present

## 2019-05-07 DIAGNOSIS — S52601D Unspecified fracture of lower end of right ulna, subsequent encounter for closed fracture with routine healing: Secondary | ICD-10-CM | POA: Diagnosis not present

## 2019-05-07 DIAGNOSIS — G4700 Insomnia, unspecified: Secondary | ICD-10-CM | POA: Diagnosis not present

## 2019-05-07 DIAGNOSIS — F419 Anxiety disorder, unspecified: Secondary | ICD-10-CM | POA: Diagnosis present

## 2019-05-07 DIAGNOSIS — E46 Unspecified protein-calorie malnutrition: Secondary | ICD-10-CM

## 2019-05-07 DIAGNOSIS — S32401D Unspecified fracture of right acetabulum, subsequent encounter for fracture with routine healing: Secondary | ICD-10-CM | POA: Diagnosis not present

## 2019-05-07 DIAGNOSIS — E875 Hyperkalemia: Secondary | ICD-10-CM | POA: Diagnosis not present

## 2019-05-07 DIAGNOSIS — R000 Tachycardia, unspecified: Secondary | ICD-10-CM | POA: Diagnosis present

## 2019-05-07 DIAGNOSIS — S82402D Unspecified fracture of shaft of left fibula, subsequent encounter for closed fracture with routine healing: Secondary | ICD-10-CM

## 2019-05-07 DIAGNOSIS — Z634 Disappearance and death of family member: Secondary | ICD-10-CM | POA: Diagnosis not present

## 2019-05-07 DIAGNOSIS — D62 Acute posthemorrhagic anemia: Secondary | ICD-10-CM | POA: Diagnosis present

## 2019-05-07 DIAGNOSIS — F17210 Nicotine dependence, cigarettes, uncomplicated: Secondary | ICD-10-CM | POA: Diagnosis present

## 2019-05-07 DIAGNOSIS — F329 Major depressive disorder, single episode, unspecified: Secondary | ICD-10-CM | POA: Diagnosis present

## 2019-05-07 DIAGNOSIS — E8809 Other disorders of plasma-protein metabolism, not elsewhere classified: Secondary | ICD-10-CM | POA: Diagnosis not present

## 2019-05-07 DIAGNOSIS — D72829 Elevated white blood cell count, unspecified: Secondary | ICD-10-CM | POA: Diagnosis present

## 2019-05-07 DIAGNOSIS — E441 Mild protein-calorie malnutrition: Secondary | ICD-10-CM | POA: Diagnosis present

## 2019-05-07 DIAGNOSIS — E871 Hypo-osmolality and hyponatremia: Secondary | ICD-10-CM | POA: Diagnosis present

## 2019-05-07 DIAGNOSIS — S72091D Other fracture of head and neck of right femur, subsequent encounter for closed fracture with routine healing: Secondary | ICD-10-CM | POA: Diagnosis not present

## 2019-05-07 DIAGNOSIS — R45850 Homicidal ideations: Secondary | ICD-10-CM | POA: Diagnosis not present

## 2019-05-07 DIAGNOSIS — G479 Sleep disorder, unspecified: Secondary | ICD-10-CM

## 2019-05-07 DIAGNOSIS — T1490XA Injury, unspecified, initial encounter: Secondary | ICD-10-CM | POA: Diagnosis not present

## 2019-05-07 DIAGNOSIS — D72828 Other elevated white blood cell count: Secondary | ICD-10-CM

## 2019-05-07 MED ORDER — ESCITALOPRAM OXALATE 10 MG PO TABS: 10.0000 mg | ORAL_TABLET | ORAL | Status: DC

## 2019-05-07 MED ORDER — ACETAMINOPHEN 325 MG PO TABS: 325.0000 mg | ORAL_TABLET | ORAL | Status: DC | PRN

## 2019-05-07 MED ORDER — TRAZODONE HCL 50 MG PO TABS: 25.0000 mg | ORAL_TABLET | Freq: Every evening | ORAL | Status: DC | PRN

## 2019-05-07 MED ORDER — OXYCODONE HCL 5 MG PO TABS
10.0000 mg | ORAL_TABLET | ORAL | Status: DC | PRN
  Administered 2019-05-07: 21:00:00 15 mg via ORAL
  Administered 2019-05-07 – 2019-05-08 (×2): 10 mg via ORAL
  Administered 2019-05-08 – 2019-05-15 (×8): 15 mg via ORAL
  Filled 2019-05-07: qty 2
  Filled 2019-05-07 (×7): qty 3
  Filled 2019-05-07: qty 2
  Filled 2019-05-07 (×3): qty 3

## 2019-05-07 MED ORDER — CHLORHEXIDINE GLUCONATE CLOTH 2 % EX PADS
6.0000 | MEDICATED_PAD | Freq: Every day | CUTANEOUS | Status: DC
  Administered 2019-05-09: 09:00:00 6 via TOPICAL

## 2019-05-07 MED ORDER — RIVAROXABAN 10 MG PO TABS
10.0000 mg | ORAL_TABLET | Freq: Every day | ORAL | Status: DC
  Filled 2019-05-07: qty 1

## 2019-05-07 MED ORDER — PROCHLORPERAZINE MALEATE 5 MG PO TABS: 5.0000 mg | ORAL_TABLET | Freq: Four times a day (QID) | ORAL | Status: DC | PRN

## 2019-05-07 MED ORDER — ALUM & MAG HYDROXIDE-SIMETH 200-200-20 MG/5ML PO SUSP: 30.0000 mL | ORAL | Status: DC | PRN

## 2019-05-07 MED ORDER — GUAIFENESIN-DM 100-10 MG/5ML PO SYRP: 5.0000 mL | ORAL_SOLUTION | Freq: Four times a day (QID) | ORAL | Status: DC | PRN

## 2019-05-07 MED ORDER — LORAZEPAM 0.5 MG PO TABS
0.5000 mg | ORAL_TABLET | Freq: Three times a day (TID) | ORAL | Status: DC | PRN
  Administered 2019-05-08: 20:00:00 0.5 mg via ORAL
  Filled 2019-05-07: qty 1

## 2019-05-07 MED ORDER — ACETAMINOPHEN 500 MG PO TABS
1000.0000 mg | ORAL_TABLET | Freq: Four times a day (QID) | ORAL | Status: DC
  Administered 2019-05-07 – 2019-05-19 (×40): 1000 mg via ORAL
  Filled 2019-05-07 (×52): qty 2

## 2019-05-07 MED ORDER — TRAZODONE HCL 50 MG PO TABS
50.0000 mg | ORAL_TABLET | Freq: Every day | ORAL | Status: DC
  Administered 2019-05-08 – 2019-05-19 (×13): 50 mg via ORAL
  Filled 2019-05-07 (×14): qty 1

## 2019-05-07 MED ORDER — TRAMADOL HCL 50 MG PO TABS
50.0000 mg | ORAL_TABLET | Freq: Four times a day (QID) | ORAL | Status: DC
  Administered 2019-05-07 – 2019-05-18 (×39): 50 mg via ORAL
  Filled 2019-05-07 (×44): qty 1

## 2019-05-07 MED ORDER — RIVAROXABAN 10 MG PO TABS
10.0000 mg | ORAL_TABLET | Freq: Every day | ORAL | Status: DC
  Administered 2019-05-08 – 2019-05-19 (×12): 10 mg via ORAL
  Filled 2019-05-07 (×14): qty 1

## 2019-05-07 MED ORDER — POLYETHYLENE GLYCOL 3350 17 G PO PACK: 17.0000 g | PACK | Freq: Every day | ORAL | Status: DC | PRN

## 2019-05-07 MED ORDER — BISACODYL 10 MG RE SUPP: 10.0000 mg | Freq: Every day | RECTAL | Status: DC | PRN

## 2019-05-07 MED ORDER — TAB-A-VITE/IRON PO TABS
1.0000 | ORAL_TABLET | Freq: Every day | ORAL | Status: DC
  Administered 2019-05-08 – 2019-05-20 (×13): 1 via ORAL
  Filled 2019-05-07 (×15): qty 1

## 2019-05-07 MED ORDER — PROCHLORPERAZINE 25 MG RE SUPP: 12.5000 mg | Freq: Four times a day (QID) | RECTAL | Status: DC | PRN

## 2019-05-07 MED ORDER — DIPHENHYDRAMINE HCL 12.5 MG/5ML PO ELIX: 12.5000 mg | ORAL_SOLUTION | Freq: Four times a day (QID) | ORAL | Status: DC | PRN

## 2019-05-07 MED ORDER — ESCITALOPRAM OXALATE 10 MG PO TABS
5.0000 mg | ORAL_TABLET | Freq: Every day | ORAL | Status: DC
  Administered 2019-05-07 – 2019-05-09 (×3): 5 mg via ORAL
  Filled 2019-05-07 (×3): qty 1

## 2019-05-07 MED ORDER — METHOCARBAMOL 500 MG PO TABS
1000.0000 mg | ORAL_TABLET | Freq: Three times a day (TID) | ORAL | Status: DC
  Administered 2019-05-07 – 2019-05-17 (×28): 1000 mg via ORAL
  Filled 2019-05-07 (×31): qty 2

## 2019-05-07 MED ORDER — DOCUSATE SODIUM 100 MG PO CAPS
100.0000 mg | ORAL_CAPSULE | Freq: Two times a day (BID) | ORAL | Status: DC
  Administered 2019-05-07 – 2019-05-19 (×11): 100 mg via ORAL
  Filled 2019-05-07 (×20): qty 1

## 2019-05-07 MED ORDER — PROCHLORPERAZINE EDISYLATE 10 MG/2ML IJ SOLN: 5.0000 mg | Freq: Four times a day (QID) | INTRAMUSCULAR | Status: DC | PRN

## 2019-05-07 MED ORDER — FLEET ENEMA 7-19 GM/118ML RE ENEM: 1.0000 | ENEMA | Freq: Once | RECTAL | Status: DC | PRN
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
## 2019-05-07 NOTE — Progress Notes (Signed)
Inpatient Rehabilitation-Admissions Coordinator   I have medical clearance from attending service for admit to CIR. Pt still wants to proceed with IP Rehab at this time. I will update RN, CM/SW on plan. We reviewed insurance letter and he has signed consent form.   Will proceed with CIR admit today.   Please call if questions.   Jhonnie Garner, OTR/L  Rehab Admissions Coordinator  [phone] 05/07/2019 2:41 PM

## 2019-05-07 NOTE — Progress Notes (Signed)
Orthopedic Tech Progress Note Patient Details:  Joseph Stokes 1990/07/26 [id number]  Ortho Devices Type of Ortho Device: Prafo boot/shoe Ortho Device/Splint Interventions: Adjustment, Application, Ordered   Post Interventions Patient Tolerated: Well Instructions Provided: Care of device, Adjustment of device   [name] 05/07/2019, 2:20 PM
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
## 2019-05-07 NOTE — Progress Notes (Signed)
Report given to IPR, Otila Kluver.

## 2019-05-07 NOTE — Progress Notes (Signed)
Today is the funeral of his lost girlfriend and the patient just needed support and someone to talk with.  The chaplain will follow-up later today.  Brion Aliment Chaplain Resident For questions concerning this note please contact me by pager [phone]
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
## 2019-05-07 NOTE — Progress Notes (Signed)
Orthopedic Tech Progress Note Patient Details:  Joseph Stokes 1990/10/16 [id number]  Ortho Devices Type of Ortho Device: Prafo boot/shoe       [name] 05/07/2019, 6:22 PM
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
## 2019-05-07 NOTE — Plan of Care (Signed)
  Problem: Education: Goal: Knowledge of General Education information will improve Description: Including pain rating scale, medication(s)/side effects and non-pharmacologic comfort measures Outcome: Progressing   Problem: Activity: Goal: Risk for activity intolerance will decrease Outcome: Progressing   Problem: Coping: Goal: Level of anxiety will decrease Outcome: Progressing   
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
## 2019-05-08 ENCOUNTER — Encounter (HOSPITAL_COMMUNITY): Payer: Self-pay

## 2019-05-08 ENCOUNTER — Inpatient Hospital Stay (HOSPITAL_COMMUNITY): Payer: Medicaid Other | Admitting: Physical Therapy

## 2019-05-08 ENCOUNTER — Inpatient Hospital Stay (HOSPITAL_COMMUNITY): Payer: Medicaid Other

## 2019-05-08 ENCOUNTER — Inpatient Hospital Stay (HOSPITAL_COMMUNITY): Payer: Medicaid Other | Admitting: Occupational Therapy

## 2019-05-08 ENCOUNTER — Other Ambulatory Visit: Payer: Self-pay

## 2019-05-08 DIAGNOSIS — D62 Acute posthemorrhagic anemia: Secondary | ICD-10-CM

## 2019-05-08 DIAGNOSIS — R000 Tachycardia, unspecified: Secondary | ICD-10-CM

## 2019-05-08 DIAGNOSIS — T07XXXA Unspecified multiple injuries, initial encounter: Secondary | ICD-10-CM | POA: Diagnosis not present

## 2019-05-08 DIAGNOSIS — E46 Unspecified protein-calorie malnutrition: Secondary | ICD-10-CM

## 2019-05-08 DIAGNOSIS — E8809 Other disorders of plasma-protein metabolism, not elsewhere classified: Secondary | ICD-10-CM

## 2019-05-08 DIAGNOSIS — D72829 Elevated white blood cell count, unspecified: Secondary | ICD-10-CM

## 2019-05-08 DIAGNOSIS — R7401 Elevation of levels of liver transaminase levels: Secondary | ICD-10-CM

## 2019-05-08 LAB — CBC WITH DIFFERENTIAL/PLATELET
Abs Immature Granulocytes: 0.32 10*3/uL — ABNORMAL HIGH (ref 0.00–0.07)
Basophils Absolute: 0.1 10*3/uL (ref 0.0–0.1)
Basophils Relative: 0 %
Eosinophils Absolute: 0.1 10*3/uL (ref 0.0–0.5)
Eosinophils Relative: 1 %
HCT: 32.5 % — ABNORMAL LOW (ref 39.0–52.0)
Hemoglobin: 10.6 g/dL — ABNORMAL LOW (ref 13.0–17.0)
Immature Granulocytes: 2 %
Lymphocytes Relative: 10 %
Lymphs Abs: 1.8 10*3/uL (ref 0.7–4.0)
MCH: 30.7 pg (ref 26.0–34.0)
MCHC: 32.6 g/dL (ref 30.0–36.0)
MCV: 94.2 fL (ref 80.0–100.0)
Monocytes Absolute: 1.5 10*3/uL — ABNORMAL HIGH (ref 0.1–1.0)
Monocytes Relative: 8 %
Neutro Abs: 14.5 10*3/uL — ABNORMAL HIGH (ref 1.7–7.7)
Neutrophils Relative %: 79 %
Platelets: 560 10*3/uL — ABNORMAL HIGH (ref 150–400)
RBC: 3.45 MIL/uL — ABNORMAL LOW (ref 4.22–5.81)
RDW: 16.7 % — ABNORMAL HIGH (ref 11.5–15.5)
WBC: 18.3 10*3/uL — ABNORMAL HIGH (ref 4.0–10.5)
nRBC: 0 % (ref 0.0–0.2)

## 2019-05-08 LAB — COMPREHENSIVE METABOLIC PANEL
ALT: 53 U/L — ABNORMAL HIGH (ref 0–44)
AST: 44 U/L — ABNORMAL HIGH (ref 15–41)
Albumin: 2.7 g/dL — ABNORMAL LOW (ref 3.5–5.0)
Alkaline Phosphatase: 65 U/L (ref 38–126)
Anion gap: 11 (ref 5–15)
BUN: 7 mg/dL (ref 6–20)
CO2: 22 mmol/L (ref 22–32)
Calcium: 8.7 mg/dL — ABNORMAL LOW (ref 8.9–10.3)
Chloride: 102 mmol/L (ref 98–111)
Creatinine, Ser: 0.91 mg/dL (ref 0.61–1.24)
GFR calc Af Amer: >60 mL/min (ref >60)
GFR calc non Af Amer: >60 mL/min (ref >60)
Glucose, Bld: 108 mg/dL — ABNORMAL HIGH (ref 70–99)
Potassium: 4.2 mmol/L (ref 3.5–5.1)
Sodium: 135 mmol/L (ref 135–145)
Total Bilirubin: 1 mg/dL (ref 0.3–1.2)
Total Protein: 5.8 g/dL — ABNORMAL LOW (ref 6.5–8.1)

## 2019-05-08 MED ORDER — INFLUENZA VAC SPLIT QUAD 0.5 ML IM SUSY
0.5000 mL | PREFILLED_SYRINGE | INTRAMUSCULAR | Status: AC
  Administered 2019-05-10: 0.5 mL via INTRAMUSCULAR
  Filled 2019-05-08: qty 0.5

## 2019-05-08 MED ORDER — PNEUMOCOCCAL VAC POLYVALENT 25 MCG/0.5ML IJ INJ
0.5000 mL | INJECTION | INTRAMUSCULAR | Status: DC
  Filled 2019-05-08: qty 0.5

## 2019-05-08 NOTE — Progress Notes (Signed)
Lower extremity venous has been completed.   Preliminary results in CV Proc.   Abram Sander 05/08/2019 11:22 AM

## 2019-05-08 NOTE — Progress Notes (Signed)
Inpatient Rehabilitation  Patient information reviewed and entered into eRehab system by Mysha Peeler M. Maddux First, M.A., CCC/SLP, PPS Coordinator.  Information including medical coding, functional ability and quality indicators will be reviewed and updated through discharge.    

## 2019-05-08 NOTE — Progress Notes (Signed)
Physical Therapy Session Note  Patient Details  Name: Joseph Stokes MRN: [id number] Date of Birth: 04/08/1991  Today's Date: 05/08/2019 PT Individual Time: 5188-4166 PT Individual Time Calculation (min): 76 min   Short Term Goals: Week 1:  PT Short Term Goal 1 (Week 1): Pt will transfer bed<>chair w/ min assist PT Short Term Goal 2 (Week 1): Pt will self-propel w/c 150' w/ supervision PT Short Term Goal 3 (Week 1): Pt will perform w/c parts management w/ min cues PT Short Term Goal 4 (Week 1): Pt will perform bed mobility w/ mod assist  Skilled Therapeutic Interventions/Progress Updates:  Ambulation/gait training;Discharge planning;Functional mobility training;Psychosocial support;Therapeutic Activities;Therapeutic Exercise;Wheelchair propulsion/positioning;Skin care/wound Producer, television/film/video;Neuromuscular re-education;Balance/vestibular training;DME/adaptive equipment instruction;Pain management;Splinting/orthotics;UE/LE Strength taining/ROM;UE/LE Coordination activities;Stair training;Patient/family education;Community reintegration   Pt received supine in bed and agreeable to therapy session stating he needs to use bathroom. Supine>sitting EOB, HOB partially elevated and using bedrails, with mod assist for B LE management and increased time for trunk upright. Sit>stand elevated EOB>RW with mod assist for lifting into standing and cuing to maintain R LE WBing precautions. Stand pivot to w/c using RW with mod assist for balance and cuing for sequencing. Stand pivot w/c<>BSC over toilet using grab bars with mod assist for lifting into standing and pivoting with max cuing for sequencing to maintain R LE WBing precautions. Therapist supported pt's B LEs onto w/c for increased pt comfort while he was on [facility]. Pt continent of bowel and bladder - performed seated peri-care with total assist. Therapist educated pt on R LE posterior hip precautions and implications on functional  mobility. Pt performed B UE w/c propulsion ~67ft x2 with supervision and intermittent hand-over-hand assist with mod cuing on proper technique for navigating around obstacles - pt reports R UE fatigue during this task. Pt requesting to have fresh air; therefore, therapist pushed him the remainder of distance to day room for patient to look out the windows. Upon doing this pt suddenly became very emotional and tearful regarding his current situation - therapist provided emotional support and pt's mood improved. Performed seated R LE long arc quads 2 x10 reps and seated ankle pumps 2x10 with cuing throughout for proper technique. Transported back to room in w/c. R lateral scoot transfer w/c>EOB using transfer board with total assist for board placement and pt using trapeze and armrests as needed for UE support to scoot his hips with mod assist - max cuing for sequencing to maintain precautions. Sit>supine with mod assist for B LE management. In supine performed the following exercises on R LE for 10 reps each: active assisted heel slides, hip abduction and back to neutral x 10reps within a very small ROM as pt reports uncomfortable hip adductor stretch near his groin with >10degrees of hip abduction, and short arc quads - all with max cuing for proper technique. Pt left supine in bed with needs in reach, bed alarm on, B LEs therapeutically positioned, and meal tray set-up.    Therapy Documentation Precautions:  Precautions Precautions: Posterior Hip, Fall Precaution Comments: RLE s/p ORIF, posterior hip precautions Required Braces or Orthoses: Knee Immobilizer - Left(locked in extension) Splint/Cast - Date Prophylactic Dressing Applied (if applicable): 05/07/19 Other Brace: extension brace L LE, resting hand splint to R UE (ulnar nerve palsy) Restrictions Weight Bearing Restrictions: Yes RUE Weight Bearing: Weight bearing as tolerated RLE Weight Bearing: Touchdown weight bearing LLE Weight Bearing:  Weight bearing as tolerated Other Position/Activity Restrictions: posterior precautiosn R hip  Pain:  Reports pain in B LEs  during session; however, unrated and not limiting pt's participation in session - therapist provided rest breaks and repositioning for pain management.    Therapy/Group: Individual Therapy  Tawana Scale, PT, DPT 05/08/2019, 3:07 PM
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
## 2019-05-08 NOTE — Care Management Note (Signed)
Inpatient [facility] Individual Statement of Services  Patient Name:  Joseph Stokes  Date:  05/08/2019  Welcome to the [facility].  Our goal is to provide you with an individualized program based on your diagnosis and situation, designed to meet your specific needs.  With this comprehensive rehabilitation program, you will be expected to participate in at least 3 hours of rehabilitation therapies Monday-Friday, with modified therapy programming on the weekends.  Your rehabilitation program will include the following services:  Physical Therapy (PT), Occupational Therapy (OT), 24 hour per day rehabilitation nursing, Therapeutic Recreaction (TR), Neuropsychology, Case Management (Social Worker), Rehabilitation Medicine, Nutrition Services and Pharmacy Services  Weekly team conferences will be held on Wednesday to discuss your progress.  Your Social Worker will talk with you frequently to get your input and to update you on team discussions.  Team conferences with you and your family in attendance may also be held.  Expected length of stay: 12-14 days  Overall anticipated outcome: CGA_min wheelchair level  Depending on your progress and recovery, your program may change. Your Social Worker will coordinate services and will keep you informed of any changes. Your Social Worker's name and contact numbers are listed  below.  The following services may also be recommended but are not provided by the [facility] will be made to provide these services after discharge if needed.  Arrangements include referral to agencies that provide these services.  Your insurance has been verified to be:  Medicaid Your primary doctor is:  None  Pertinent information will be shared with your doctor and your insurance company.  Social Worker:   Ovidio Kin, [place] or ([phone]  Information discussed with and copy given to patient by: Elease Hashimoto, 05/08/2019, 10:07 AM
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
## 2019-05-09 NOTE — Discharge Instructions (Addendum)
Inpatient Rehab Discharge Instructions  Joseph Stokes Discharge date and time: 05/20/19    Activities/Precautions/ Functional Status: Activity: no lifting, driving, or strenuous exercise till cleared by MD Diet: regular diet Wound Care:   1. Sacral tract: Cleanse with normal saline. Change moisten idoform gauze and packing it twice a day. Pack tract at 7 o' clock and 11 o'clock with iodoform guaze, cover with dressing and change more frequently if soiled.  2. DO NOTremove yellow xeroform layer from left thigh for any reason. It does not need any dressing on it.  3. For area on left calf--cover with Adaptic (clear oil based dressing) then 4x4's, abd pain and kerlix. Keep leg elevated as much as possible and limit bed pressure on back of the calf 4. R groin: check R groin wound daily, clean with soap and water, keep as dry as possibl   Functional status:  ___ No restrictions     ___ Walk up steps independently _X__ 24/7 supervision/assistance   ___ Walk up steps with assistance ___ Intermittent supervision/assistance  ___ Bathe/dress independently ___ Walk with walker     ___ Bathe/dress with assistance ___ Walk Independently    ___ Shower independently _X__ Walk with supervision    _X__ Shower with assistance _X__ No alcohol     ___ Return to work/school ________  Special Instructions: 1. Touch down weight on Right leg.  2. Need to wear brace on left knee when out of bed/moving around.    COMMUNITY REFERRALS UPON DISCHARGE:   None:  CAN NOT FIND A HOME HEALTH AGENCY TO TAKE HIS MEDICAID   Medical Equipment/Items Ordered:WHEELCHAIR, Levan Hurst, BEDSIDE COMMODE & TUB BENCH  Agency/Supplier:[employer]  [phone]   My questions have been answered and I understand these instructions. I will adhere to these goals and the provided educational materials after my discharge from the hospital.  Patient/Caregiver Signature _______________________________ Date  __________  Clinician Signature _______________________________________ Date __________  Please bring this form and your medication list with you to all your follow-up doctor's appointments. Information on my medicine - XARELTO (Rivaroxaban)  This medication education was reviewed with me or my healthcare representative as part of my discharge preparation.  Why was Xarelto prescribed for you? Xarelto was prescribed for you to reduce the risk of blood clots forming after orthopedic surgery. The medical term for these abnormal blood clots is venous thromboembolism (VTE).  What do you need to know about xarelto ? Take your Xarelto ONCE DAILY at the same time every day. You may take it either with or without food.  If you have difficulty swallowing the tablet whole, you may crush it and mix in applesauce just prior to taking your dose.  Take Xarelto exactly as prescribed by your doctor and DO NOT stop taking Xarelto without talking to the doctor who prescribed the medication.  Stopping without other VTE prevention medication to take the place of Xarelto may increase your risk of developing a clot.  After discharge, you should have regular check-up appointments with your healthcare provider that is prescribing your Xarelto.    What do you do if you miss a dose? If you miss a dose, take it as soon as you remember on the same day then continue your regularly scheduled once daily regimen the next day. Do not take two doses of Xarelto on the same day.   Important Safety Information A possible side effect of Xarelto is bleeding. You should call your healthcare provider right away if you experience any  of the following: ? Bleeding from an injury or your nose that does not stop. ? Unusual colored urine (red or dark brown) or unusual colored stools (red or black). ? Unusual bruising for unknown reasons. ? A serious fall or if you hit your head (even if there is no bleeding).  Some  medicines may interact with Xarelto and might increase your risk of bleeding while on Xarelto. To help avoid this, consult your healthcare provider or pharmacist prior to using any new prescription or non-prescription medications, including herbals, vitamins, non-steroidal anti-inflammatory drugs (NSAIDs) and supplements.  This website has more information on Xarelto: [email or web address]

## 2019-05-10 ENCOUNTER — Inpatient Hospital Stay (HOSPITAL_COMMUNITY): Payer: Medicaid Other | Admitting: Occupational Therapy

## 2019-05-10 ENCOUNTER — Inpatient Hospital Stay (HOSPITAL_COMMUNITY): Payer: Medicaid Other | Admitting: Physical Therapy

## 2019-05-10 DIAGNOSIS — T07XXXA Unspecified multiple injuries, initial encounter: Secondary | ICD-10-CM | POA: Diagnosis not present

## 2019-05-10 MED ORDER — ESCITALOPRAM OXALATE 10 MG PO TABS
10.0000 mg | ORAL_TABLET | Freq: Every day | ORAL | Status: DC
  Administered 2019-05-10 – 2019-05-17 (×8): 10 mg via ORAL
  Filled 2019-05-10 (×12): qty 1

## 2019-05-10 NOTE — Progress Notes (Signed)
Occupational Therapy Session Note  Patient Details  Name: Joseph Stokes MRN: [id number] Date of Birth: 08/15/1990  Today's Date: 05/10/2019 OT Individual Time: [phone] and [id number] OT Individual Time Calculation (min): 92 min and 48 min  Short Term Goals: Week 1:  OT Short Term Goal 1 (Week 1): Pt will complete stand pivot transfer to toilet with mod assist OT Short Term Goal 2 (Week 1): Pt will complete LB dressing with mod assist with AE PRN OT Short Term Goal 3 (Week 1): Pt will complete bathing with mod assist with AE PRN  Skilled Therapeutic Interventions/Progress Updates:    Pt greeted in bed with no c/o pain. Able to state WB precautions himself but needed A for recall of hip precautions. Wanting to get washed up. OT-RN collaboration during bathing and dressing changes bedlevel. Pt able to roll Rt>Lt using overhead trapeze and bedrails with Mod A and pillow placed between his legs. Noted increased drainage from inguinal abscess, requiring increased time for thorough hygiene. Max A for pants and L LE brace locked into extension. Mod A for supine<sit and for stand pivot<BSC using RW after he donned his shirt. Vcs for bearing weight only through UEs and Lt leg during transfer with pt verbalizing compliancy. While seated he had a large BM. Max A for hygiene while he leaned forward and towards the Lt side. Max A for clothing mgt in standing before transferring back to EOB. Note he had posterior LOB back onto bed, requiring assist to regain sitting balance. He then completed another Mod A transfer to the w/c. Positioned him at the sink to complete oral care with setup. Notified NT to check on him in a few minutes for repositioning at bedside. Pt verbalized he would not attempt transfer without staff assist. Tx focus placed on ADL retraining, precaution adherence during functional activity, activity tolerance, and functional transfers.   Pt was often teary, missing his girlfriend. Provided pt  with emotional support for duration of tx.     2nd Session 1:1 tx (48 min) Pt greeted in bed, very motivated to get OOB for tx due to buttocks soreness. Supine<sit completed with Mod A for L LE guidance while wearing his brace. Mod A for stand pivot<w/c using RW with demonstrational and vcs for technique and precaution adherence. While seated, initiated AE training with reacher and sock aide. Had pt practice donning "pants" using theraband for simulation. With practice he was able to thread both LEs through band using the reacher with vcs alone. Pt was also able to don both gripper socks with supervision. Per pt: "that's a workout!" We also discussed emotional coping strategies for psychosocial health and healing. Provided him with a small journal. Encouraged using it for raw journaling, poetry, or music writing. He was receptive to education. Pt remained in the w/c with all needs and safety belt fastened.    Therapy Documentation Precautions:  Precautions Precautions: Posterior Hip, Fall Precaution Comments: RLE s/p ORIF, posterior hip precautions Required Braces or Orthoses: Knee Immobilizer - Left(locked in extension) Splint/Cast - Date Prophylactic Dressing Applied (if applicable): 32/35/57 Other Brace: extension brace L LE, resting hand splint to R UE (ulnar nerve palsy) Restrictions Weight Bearing Restrictions: Yes RUE Weight Bearing: Weight bearing as tolerated RLE Weight Bearing: Touchdown weight bearing LLE Weight Bearing: Weight bearing as tolerated Other Position/Activity Restrictions: posterior precautiosn R hip Pain: Buttocks pain in 2nd session. Education provided regarding pressure relief strategies/exercises to implement while sitting in the w/c to address   ADL: ADL  Grooming: Setup Upper Body Bathing: Contact guard Where Assessed-Upper Body Bathing: Edge of bed Lower Body Bathing: Dependent Where Assessed-Lower Body Bathing: Bed level Upper Body Dressing: Moderate  assistance Where Assessed-Upper Body Dressing: Edge of bed Lower Body Dressing: Dependent Where Assessed-Lower Body Dressing: Bed level Toileting: Dependent(+2) Where Assessed-Toileting: Bedside Commode Toilet Transfer: Moderate assistance(+2) Toilet Transfer Method: Stand pivot Toilet Transfer Equipment: Bedside commode      Therapy/Group: Individual Therapy  Lacharles Altschuler A Jacqualine Weichel 05/10/2019, 12:33 PM
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
## 2019-05-10 NOTE — Progress Notes (Signed)
Dressing change due 10/24. Contacted Ortho and instructed to change dressing to LLE wound site per written order. Dressing changed. Moderate serosanguinous drainage noted on prior dressing, scant sanguinous drainage during dressing change. Patient tolerated well.
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
## 2019-05-10 NOTE — Progress Notes (Signed)
Physical Therapy Session Note  Patient Details  Name: Joseph Stokes MRN: [id number] Date of Birth: 1990-09-22  Today's Date: 05/10/2019 PT Individual Time: 1109-1222 PT Individual Time Calculation (min): 73 min   Short Term Goals: Week 1:  PT Short Term Goal 1 (Week 1): Pt will transfer bed<>chair w/ min assist PT Short Term Goal 2 (Week 1): Pt will self-propel w/c 150' w/ supervision PT Short Term Goal 3 (Week 1): Pt will perform w/c parts management w/ min cues PT Short Term Goal 4 (Week 1): Pt will perform bed mobility w/ mod assist  Skilled Therapeutic Interventions/Progress Updates:    Pt received sitting in w/c and agreeable to therapy session but reporting his buttocks is hurting from sitting in the w/c for a few hours this morning. Therapist donned L LE knee extension brace with +2 assist to maintain L knee extended. Pt performed ~45ft B UE w/c propulsion before reporting fatigue in R arm and requesting assistance for remainder of distance to therapy gym. R lateral scoot transfer w/c>EOM with +2 assist to maintain LE precautions and for lifting/scooting his hips - max cuing for sequencing and proper UE placemen to improve hip clearance.  Performed the following exercises in supine: - R LE active assisted hip abduction/adduction to neutral 2x10reps with pt demonstrating increased ROM compared to prior session - RLE active assisted heel slides 2x10reps with pt demonstrating inability to perform hip flexion >40degrees without increased hip pain - ankle pumps 2x20 each LE - R UE supine bicep curl 1x30reps with 3lb weight and 1x20 reps with 5lb wieght - R UE supine shoulder press x15 reps with 3lb weight and cuing to maintain proper UE alignment Therapist provided multimodal cuing throughout exercises for proper technique/form. Pt suddenly becoming emotional and crying during session stating "I miss my girl." Therapist and therapy tech provided emotional support as pt continued to be  upset for ~21minutes crying out with therapy tech offering to pray with patient for increased comfort, which pt appreciated. Once pt's mood improve pt performed supine>sit EOB with +2 mod assist for B LE management and trunk upright with mod cuing for sequencing. L lateral scoot transfer EOM>w/c with mod assist of 1 for lifting/scooting hips and 2nd person maintaining R LE WBing precautions. Transported back to room in w/c. L lateral scoot transfer w/c>EOB with again therapist providing mod assist for lifting/scooting hips and therapy tech ensuring R LE WBing precautions with therapist providing max cuing for proper UE placement to push and lift bottom as pt continues to demonstrate poor problem solving to perform this transfer. Sit>supine with +2 mod assist for B LE management and trunk descent. Pt left supine in bed with hand-off to NT to assist with bed pan toileting.  Therapy Documentation Precautions:  Precautions Precautions: Posterior Hip, Fall Precaution Comments: RLE s/p ORIF, posterior hip precautions Required Braces or Orthoses: Knee Immobilizer - Left(locked in extension) Splint/Cast - Date Prophylactic Dressing Applied (if applicable): 71/06/26 Other Brace: extension brace L LE, resting hand splint to R UE (ulnar nerve palsy) Restrictions Weight Bearing Restrictions: Yes RUE Weight Bearing: Weight bearing as tolerated RLE Weight Bearing: Touchdown weight bearing LLE Weight Bearing: Weight bearing as tolerated Other Position/Activity Restrictions: posterior precautiosn R hip  Pain:   Reports pain in B LEs throughout session; however, reports recent pain medication administration - therapist provided modification of exercises, rest breaks, relaxation techniques, and repositioning for pain management throughout session.   Therapy/Group: Individual Therapy  Tawana Scale, PT, DPT 05/10/2019, 8:00 AM

## 2019-05-11 ENCOUNTER — Inpatient Hospital Stay (HOSPITAL_COMMUNITY): Payer: Medicaid Other | Admitting: Occupational Therapy

## 2019-05-11 ENCOUNTER — Inpatient Hospital Stay (HOSPITAL_COMMUNITY): Payer: Medicaid Other

## 2019-05-11 ENCOUNTER — Encounter (HOSPITAL_COMMUNITY): Payer: Medicaid Other | Admitting: Psychology

## 2019-05-11 DIAGNOSIS — R7401 Elevation of levels of liver transaminase levels: Secondary | ICD-10-CM | POA: Diagnosis not present

## 2019-05-11 DIAGNOSIS — R000 Tachycardia, unspecified: Secondary | ICD-10-CM | POA: Diagnosis not present

## 2019-05-11 DIAGNOSIS — T1490XA Injury, unspecified, initial encounter: Secondary | ICD-10-CM

## 2019-05-11 DIAGNOSIS — F411 Generalized anxiety disorder: Secondary | ICD-10-CM

## 2019-05-11 DIAGNOSIS — D72829 Elevated white blood cell count, unspecified: Secondary | ICD-10-CM | POA: Diagnosis not present

## 2019-05-11 LAB — CBC WITH DIFFERENTIAL/PLATELET
Abs Immature Granulocytes: 0.22 10*3/uL — ABNORMAL HIGH (ref 0.00–0.07)
Basophils Absolute: 0.1 10*3/uL (ref 0.0–0.1)
Basophils Relative: 0 %
Eosinophils Absolute: 0.1 10*3/uL (ref 0.0–0.5)
Eosinophils Relative: 0 %
HCT: 33.2 % — ABNORMAL LOW (ref 39.0–52.0)
Hemoglobin: 11.6 g/dL — ABNORMAL LOW (ref 13.0–17.0)
Immature Granulocytes: 1 %
Lymphocytes Relative: 9 %
Lymphs Abs: 1.4 10*3/uL (ref 0.7–4.0)
MCH: 31.4 pg (ref 26.0–34.0)
MCHC: 34.9 g/dL (ref 30.0–36.0)
MCV: 90 fL (ref 80.0–100.0)
Monocytes Absolute: 1.3 10*3/uL — ABNORMAL HIGH (ref 0.1–1.0)
Monocytes Relative: 8 %
Neutro Abs: 13.2 10*3/uL — ABNORMAL HIGH (ref 1.7–7.7)
Neutrophils Relative %: 82 %
RBC: 3.69 MIL/uL — ABNORMAL LOW (ref 4.22–5.81)
RDW: 15.8 % — ABNORMAL HIGH (ref 11.5–15.5)
WBC: 16.3 10*3/uL — ABNORMAL HIGH (ref 4.0–10.5)
nRBC: 0.1 % (ref 0.0–0.2)

## 2019-05-11 LAB — COMPREHENSIVE METABOLIC PANEL
ALT: 38 U/L (ref 0–44)
AST: 52 U/L — ABNORMAL HIGH (ref 15–41)
Albumin: 2.8 g/dL — ABNORMAL LOW (ref 3.5–5.0)
Alkaline Phosphatase: 73 U/L (ref 38–126)
Anion gap: 13 (ref 5–15)
BUN: 12 mg/dL (ref 6–20)
CO2: 20 mmol/L — ABNORMAL LOW (ref 22–32)
Calcium: 9.1 mg/dL (ref 8.9–10.3)
Chloride: 104 mmol/L (ref 98–111)
Creatinine, Ser: 0.98 mg/dL (ref 0.61–1.24)
GFR calc Af Amer: >60 mL/min (ref >60)
GFR calc non Af Amer: >60 mL/min (ref >60)
Glucose, Bld: 104 mg/dL — ABNORMAL HIGH (ref 70–99)
Potassium: 5.5 mmol/L — ABNORMAL HIGH (ref 3.5–5.1)
Sodium: 137 mmol/L (ref 135–145)
Total Bilirubin: 0.8 mg/dL (ref 0.3–1.2)
Total Protein: 6.1 g/dL — ABNORMAL LOW (ref 6.5–8.1)

## 2019-05-11 NOTE — Progress Notes (Signed)
Transitions of Care Pharmacist Note  Joseph Stokes is a 28 y.o. male that will be prescribed Xarelto (rivaroxaban) at discharge for 30 days per ortho (through 11/20).   Patient Education: I provided the following education on rivaroxaban to the patient: How to take the medication Described what the medication is Signs of bleeding Answered their questions  Discharge Medications Plan: The patient wants to have their discharge medications filled by the Transitions of Care pharmacy rather than their usual pharmacy.  The discharge orders pharmacy has been changed to the [facility] pharmacy, the patient will receive a phone call regarding co-pay, and their medications will be delivered by the [facility] pharmacy.   Insurance information: Patient's mother will bring in Medical sales representative. Advised to request nursing to call [facility] pharmacy when family member arrives with insurance information.   Thank you,   Cristela Felt, PharmD PGY1 Pharmacy Resident [employer]: [phone]  May 11, 2019
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
## 2019-05-11 NOTE — Progress Notes (Signed)
Occupational Therapy Session Note  Patient Details  Name: Joseph Stokes MRN: [id number] Date of Birth: 10/07/1990  Today's Date: 05/11/2019 OT Individual Time: 5631-4970 and 2637-8588 OT Individual Time Calculation (min): 50 min and 55 min   Short Term Goals: Week 1:  OT Short Term Goal 1 (Week 1): Pt will complete stand pivot transfer to toilet with mod assist OT Short Term Goal 2 (Week 1): Pt will complete LB dressing with mod assist with AE PRN OT Short Term Goal 3 (Week 1): Pt will complete bathing with mod assist with AE PRN  Skilled Therapeutic Interventions/Progress Updates:    1) Treatment session with focus on bed mobility to complete LB bathing/dressing, and functional transfers.  Pt received supine in bed expressing desire to wash up.  Pt completed bathing at bed level with mod assist for rolling and to wash buttocks and pt reporting feeling very dirty and sweaty.  Max assist for LB dressing with assist to thread pant legs despite use of reacher.  Pt able to complete partial bridging and lifting individual hips to allow therapist to pull remainder of pants over hips.  Pt utilized trapeze bar overhead to assist with boosting and scooting hips to EOB while therapist advanced BLE.  Slide board transfer mod assist bed > w/c with assist for placement and to ensure head/hips relationship and TDWB through RLE.  Pt completed UB bathing and dressing and oral care seated at sink with setup assist.  Pt remained upright in w/c with all needs in reach.  2) Treatment session with focus on sit <> stand and functional transfers.  Pt received supine in bed reporting fatigue but willing to engage in therapy session.  Pt stating that he doesn't get much rest due to family and friends calling frequently and frequent visits by staff.  Discussed setting boundaries and limits with family to allow for increased rest during down times.  Pt completed bed mobility with mod assist to advance BLE to EOB while pt  utilized overhead trapeze bar to boost and scoot hips towards EOB.  Engaged in sit > stand with RW with Min assist and cues for hand placement and TDWB through RLE.  Pt completed 8' ambulation with RW with cues for TDWB, with pt able to maintain -often defaulting to NWB.  Engaged in blocked practice sit > stand with focus on hand placement and sequencing during sit <> stand to maintain TDWB on RLE and knee extension (in brace) with LLE.  Pt able to complete with CGA and min cues for positioning prior to stand > sit.  Pt ambulated 10' x2 with cues for gait pattern with TDWB, however pt continues to default to NWB through RLE.  Pt returned to room and left upright in w/c with meal tray setup as pt reporting finally having an appetite.   Therapy Documentation Precautions:  Precautions Precautions: Posterior Hip, Fall Precaution Comments: RLE s/p ORIF, posterior hip precautions Required Braces or Orthoses: Knee Immobilizer - Left(locked in extension) Splint/Cast - Date Prophylactic Dressing Applied (if applicable): 50/27/74 Other Brace: extension brace L LE, resting hand splint to R UE (ulnar nerve palsy) Restrictions Weight Bearing Restrictions: Yes RUE Weight Bearing: Weight bearing as tolerated RLE Weight Bearing: Touchdown weight bearing LLE Weight Bearing: Weight bearing as tolerated Other Position/Activity Restrictions: posterior precautiosn R hip Pain: Pain Assessment Pain Scale: 0-10 Pain Score: 6 Pain Type: Acute pain Pain Location: Leg Pain Orientation: Right;Left Pain Descriptors / Indicators: Aching;Sore Pain Frequency: Intermittent Pain Onset: Gradual Pain Intervention(s):  RN notified   Therapy/Group: Individual Therapy  Rosalio Loud 05/11/2019, 12:34 PM

## 2019-05-11 NOTE — Progress Notes (Signed)
Physical Therapy Session Note  Patient Details  Name: Joseph Stokes MRN: [id number] Date of Birth: 1991-04-17  Today's Date: 05/11/2019 PT Individual Time: 1100-1159 and 1300-1330 PT Individual Time Calculation (min): 59 min and 30 min  Short Term Goals: Week 1:  PT Short Term Goal 1 (Week 1): Pt will transfer bed<>chair w/ min assist PT Short Term Goal 2 (Week 1): Pt will self-propel w/c 150' w/ supervision PT Short Term Goal 3 (Week 1): Pt will perform w/c parts management w/ min cues PT Short Term Goal 4 (Week 1): Pt will perform bed mobility w/ mod assist  Skilled Therapeutic Interventions/Progress Updates:   Treatment Session 1:  Received pt sitting upright in WC, pt agreeable to PT, and denied any pain prior to session. Pt very emotional at start of session, threapist consoled pt. Throughout session pt reported pain/discomfort in buttocks and low back but did not state pain number. Session focused on functional mobility/transfers, UE/LE strength, bed mobility, balance, and improved tolerance to activity. Pt performed WC mobility 63ft using bilateral UE's with supervision. Pt transferred WC<>mat with slideboard mod A +2 while maintaining WB precautions. Pt performed 1x10 tricep pushups sitting on mat. Pt transferred sit<>supine mod A for LE management. Pt reported he was unable to get comfortable in supine. Pt rolled supine<>R sidelying mod A for LE management with verbal cues for UE use trunk positioning. Pt transferred R sidelying<>sitting EOB mod A for trunk control and LE management. Pt transferred mat<>WC mod A with slideboard for LE management and verbal cues to maintain WB precautions. Pt reported discomfort in buttocks from sitting and requested to return to room. Pt transferred WC<>bed with slideboard mod A and sit<>supine mod A for LE management. Concluded session with pt supine in bed law enforcement present speaking with pt.   Treatment Session 2: Received pt supine in bed,  pt agreeable to do therapy but requested to do bed exercises. Pt denied any pain during session, stating he just received meds prior to therapy. Pt performed all exercises in supine during session. Session focused on bed mobility, LE strength, and improved activity tolerance. Pt performed RLE active assisted heel slides 3x10 while maintaining precautions. Pt performed bilateral active assisted hip abduction 2x10 with verbal/tactile cues for muscle activation and attention while maintaining precautions. Pt performed 2x10 isometric quadriceps contraction on RLE 10x5 second hold. Pt performed 1x20 PF/DF ankle pumps with verbal cues for full ROM. Pt performed 2x10 bilateral active assisted SLR with tactile cues for quadriceps activation. Therapist assisted with repositioning by placing 2 pillows under each hip to provide pressure relief for constant buttock discomfort. Pt reported fatigue, stating he goes to sleep around 1:00am and wakes up around 5:00am every day, pt reports decreased appetite as well and stated he thinks that is part of the reason he is not motivated during therapy. Pt was educated on importance of functional mobility and participation in therapy. Pt was also educated on D/C planning and expectations in order to D/C home. Pt verbalized understanding. Concluded session with pt supine in bed, needs within reach, and bed alarm on.   Therapy Documentation Precautions:  Precautions Precautions: Posterior Hip, Fall Precaution Comments: RLE s/p ORIF, posterior hip precautions Required Braces or Orthoses: Knee Immobilizer - Left(locked in extension) Splint/Cast - Date Prophylactic Dressing Applied (if applicable): 05/07/19 Other Brace: extension brace L LE, resting hand splint to R UE (ulnar nerve palsy) Restrictions Weight Bearing Restrictions: Yes RUE Weight Bearing: Weight bearing as tolerated RLE Weight Bearing: Touchdown weight bearing  LLE Weight Bearing: Weight bearing as tolerated Other  Position/Activity Restrictions: posterior precautiosn R hip  Therapy/Group: Individual Therapy  Netta Corrigan, PT, DPT, CSRS 05/11/19  4:34 PM   Alfonse Alpers PT, DPT   05/11/2019, 8:00 AM

## 2019-05-11 NOTE — Progress Notes (Signed)
Bathroom call light sounding. Upon entering room Patient noted sitting in wheelchair at bedside. Patient admitted transferring unassisted from toilet to wheelchair. Patient stated, "I had to get up out of there". Safety precautions reviewed with understanding verbalized.

## 2019-05-11 NOTE — Progress Notes (Signed)
[place] PHYSICAL MEDICINE & REHABILITATION PROGRESS NOTE  Subjective/Complaints: Patient seen sitting up in bed this morning.  States he slept well overnight.  He notes he has some cramping in his abdomen when he sits in his wheelchair for prolonged periods of time.  ROS: Denies CP, SOB, N/V/D  Objective: Vital Signs: Blood pressure 121/75, pulse 100, temperature 98 F (36.7 C), temperature source Oral, resp. rate 16, height 5\' 4"  (1.626 m), weight 75.5 kg, SpO2 100 %. No results found. No results for input(s): WBC, HGB, HCT, PLT in the last 72 hours. No results for input(s): NA, K, CL, CO2, GLUCOSE, BUN, CREATININE, CALCIUM in the last 72 hours.  Physical Exam: BP 121/75 (BP Location: Right Arm)   Pulse 100   Temp 98 F (36.7 C) (Oral)   Resp 16   Ht 5\' 4"  (1.626 m)   Wt 75.5 kg   SpO2 100%   BMI 28.57 kg/m  Constitutional: No distress . Vital signs reviewed. HENT: Normocephalic.  Atraumatic. Eyes: EOMI. No discharge. Cardiovascular: No JVD. Respiratory: Normal effort.  No stridor. GI: Non-distended. Skin: Scattered abrasions and incisions C/D/I Graft site with dressing  Psych: Normal mood.  Normal behavior. Musc: Edema and tenderness in right upper extremity and bilateral lower extremities. Neurological: Alert Motor: RUE: 4/5 proximal to distal (pain inhibition) LUE: 5/5 proximal to distal RLE: HF 2/5, KE 4-/5, ADF 4 -/5 LLE: HF 3 -/5, KE 2/5, ankle limited due to bracing, wiggles toes Sensation intact to light touch  Psychiatric: flat but cooperative.   Assessment/Plan: 1. Functional deficits secondary to polytrauma which require 3+ hours per day of interdisciplinary therapy in a comprehensive inpatient rehab setting.  Physiatrist is providing close team supervision and 24 hour management of active medical problems listed below.  Physiatrist and rehab team continue to assess barriers to discharge/monitor patient progress toward functional and medical  goals  Care Tool:  Bathing    Body parts bathed by patient: Right arm, Left arm, Chest, Abdomen, Front perineal area, Face   Body parts bathed by helper: Buttocks, Right upper leg, Left upper leg, Right lower leg Body parts n/a: Left lower leg   Bathing assist Assist Level: Moderate Assistance - Patient 50 - 74%     Upper Body Dressing/Undressing Upper body dressing   What is the patient wearing?: Pull over shirt    Upper body assist Assist Level: Moderate Assistance - Patient 50 - 74%    Lower Body Dressing/Undressing Lower body dressing      What is the patient wearing?: Pants     Lower body assist Assist for lower body dressing: Maximal Assistance - Patient 25 - 49%     Toileting Toileting    Toileting assist Assist for toileting: Maximal Assistance - Patient 25 - 49%     Transfers Chair/bed transfer  Transfers assist  Chair/bed transfer activity did not occur: Safety/medical concerns  Chair/bed transfer assist level: 2 Helpers     Locomotion Ambulation   Ambulation assist   Ambulation activity did not occur: Safety/medical concerns          Walk 10 feet activity   Assist  Walk 10 feet activity did not occur: Safety/medical concerns        Walk 50 feet activity   Assist Walk 50 feet with 2 turns activity did not occur: Safety/medical concerns         Walk 150 feet activity   Assist Walk 150 feet activity did not occur: Safety/medical concerns  Walk 10 feet on uneven surface  activity   Assist Walk 10 feet on uneven surfaces activity did not occur: Safety/medical concerns         Wheelchair     Assist Will patient use wheelchair at discharge?: Yes Type of Wheelchair: Manual    Wheelchair assist level: Set up assist, Supervision/Verbal cueing Max wheelchair distance: 4940ft    Wheelchair 50 feet with 2 turns activity    Assist        Assist Level: Supervision/Verbal cueing, Set up assist    Wheelchair 150 feet activity     Assist Wheelchair 150 feet activity did not occur: Safety/medical concerns          Medical Problem List and Plan: 1.  Deficits with mobility, endurance, self-care secondary to polytrauma.  Continue CIR 2.  Antithrombotics: -DVT/anticoagulation:  Pharmaceutical: Xarelto             -antiplatelet therapy: n/A 3. Pain Management: Oxycodone prn effective at present  Relatively controlled on 10/26 4. Mood/significant grieving given his lost  -pastoral and family support  -has prn ativan ordered  -Increased lexapro to 10mg  qhs             -antipsychotic agents: N/A 5. Neuropsych: This patient is capable of making decisions on his own behalf. 6. Skin/Wound Care: Added protein supplement to promote healing.  Per ortho--to keep all other wounds open to air  Dressing change for stsg recipient site as follows adaptic, 4x4, kerlix and Ace wrap Change q 2-3 days (next dressing change would be 05/09/2019,we are on call this weekend and will do this) Do notclean wound with any ointments, solutions, solvents.  Donor site instructions   DO NOTremove yellow xeroform layer for any reason  Pt to dab blood droplets as they form  Fan to xeroform layer to dry out site/sites DO NOT cover donor sites with any dressings, allow them to stay exposed to air  Ok to coverwhen usinghinged brace to mobilizeafter patient has mobilized to destination please remove hinged brace and dressing to allow Xeroform to be exposed to air Gently trim edges as they roll up    TO keep wounds open to air. Dressing changes to graft site every 2-3  days-->next to be done by ortho.  7. Fluids/Electrolytes/Nutrition: Monitor I/O.  8. Pelvic ring fracture/R-acetabular fracture/R-femoral neck fracture s/p ORIF: TDWB RLE with posterior hip precautions.  9. Traumatic left knee injury s/p quad tendon repair: WBAT with LLE brace locked in full extension for 4 weeks.  10. Complex soft tissue injuries LLE skin graft:  To LEAVE yellow xeroform in place and to keep donor site open to air. 11. ABLA:   Hemoglobin 10.6 on 10/23, likely some component of concentration  Labs pending 12. Leucocytosis:              Monitor for injection  Afebrile             WBCs 18.3 on 10/23-some component of concentration.    Labs pending 13.  H/o anxiety disorder: Currently getting ativan 1 mg tid prn--will resume Lexapro (was using prn PTA)              Ego support 14.  Sinus tachycardia-likely multifactorial given blood loss, pain, endurance, anxiety  ECG reviewed on 10/21, showing same  Persistent, will await labs for today 15.  Transaminitis  LFTs elevated on 10/23, labs pending 16.  Hypoalbuminemia  Supplement initiated  LOS: 4 days A FACE TO FACE EVALUATION WAS PERFORMED  Earlyn Sylvan Lorie Phenix 05/11/2019, 9:03 AM

## 2019-05-11 NOTE — Plan of Care (Signed)
  Problem: RH BOWEL ELIMINATION Goal: RH STG MANAGE BOWEL WITH ASSISTANCE Description: STG Manage Bowel with max Assistance. Outcome: Progressing   Problem: RH BLADDER ELIMINATION Goal: RH STG MANAGE BLADDER WITH ASSISTANCE Description: STG Manage Bladder With mod I Assistance Outcome: Progressing   Problem: RH SKIN INTEGRITY Goal: RH STG MAINTAIN SKIN INTEGRITY WITH ASSISTANCE Description: STG Maintain Skin Integrity With Assistance. Max Outcome: Progressing Goal: RH STG ABLE TO PERFORM INCISION/WOUND CARE W/ASSISTANCE Description: STG Able To Perform Incision/Wound Care With Assistance. Max Outcome: Progressing   Problem: RH SAFETY Goal: RH STG ADHERE TO SAFETY PRECAUTIONS W/ASSISTANCE/DEVICE Description: STG Adhere to Safety Precautions With Assistance/Device. Min Outcome: Progressing   Problem: RH PAIN MANAGEMENT Goal: RH STG PAIN MANAGED AT OR BELOW PT'S PAIN GOAL Description: Below 3 Outcome: Not Progressing

## 2019-05-12 ENCOUNTER — Inpatient Hospital Stay (HOSPITAL_COMMUNITY): Payer: Medicaid Other | Admitting: Occupational Therapy

## 2019-05-12 ENCOUNTER — Inpatient Hospital Stay (HOSPITAL_COMMUNITY): Payer: Medicaid Other | Admitting: Physical Therapy

## 2019-05-12 ENCOUNTER — Inpatient Hospital Stay (HOSPITAL_COMMUNITY): Payer: Medicaid Other

## 2019-05-12 DIAGNOSIS — E875 Hyperkalemia: Secondary | ICD-10-CM

## 2019-05-12 LAB — BASIC METABOLIC PANEL
Anion gap: 13 (ref 5–15)
BUN: 9 mg/dL (ref 6–20)
CO2: 23 mmol/L (ref 22–32)
Calcium: 9.2 mg/dL (ref 8.9–10.3)
Chloride: 98 mmol/L (ref 98–111)
Creatinine, Ser: 0.94 mg/dL (ref 0.61–1.24)
GFR calc Af Amer: >60 mL/min (ref >60)
GFR calc non Af Amer: >60 mL/min (ref >60)
Glucose, Bld: 112 mg/dL — ABNORMAL HIGH (ref 70–99)
Potassium: 3.8 mmol/L (ref 3.5–5.1)
Sodium: 134 mmol/L — ABNORMAL LOW (ref 135–145)

## 2019-05-12 NOTE — Progress Notes (Signed)
Occupational Therapy Session Note  Patient Details  Name: Joseph Stokes MRN: [id number] Date of Birth: [birth year]  Today's Date: 05/12/2019 OT Individual Time: 3419-3790 OT Individual Time Calculation (min): 60 min    Short Term Goals: Week 1:  OT Short Term Goal 1 (Week 1): Pt will complete stand pivot transfer to toilet with mod assist OT Short Term Goal 2 (Week 1): Pt will complete LB dressing with mod assist with AE PRN OT Short Term Goal 3 (Week 1): Pt will complete bathing with mod assist with AE PRN  Skilled Therapeutic Interventions/Progress Updates:    Treatment session with focus on functional transfers, sit > stand, standing tolerance, and LUE use.  Pt received upright in bed declining bathing/dressing.  Completed bed mobility with mod assist to advance BLE to EOB while pt utilized overhead trapeze to reposition UB.  Engaged in sit > stand from EOB with initial min assist faded to CGA during ambulation to sink with min cues for TDWB through RLE.  Pt completed oral care in standing with CGA.  Pt propelled w/c 60' for BUE strengthening and endurance.  Engaged in stand pivot transfers with focus on sequencing and recall of hand placement and positioning prior to sitting.  Notified RN of change in safety plan to complete stand pivot transfers Min A - CGA to bed and toilet with RN observing and reporting understanding.  Engaged in functional use of LUE with grasp, in-hand manipulation, and translation to focus on increased use of 4th and 5th digits due to ulnar injury.  Provided pt with resistive foam for individual finger flexion and gross grasp.  Pt returned to room and left upright in w/c with all needs in reach.  Therapy Documentation Precautions:  Precautions Precautions: Posterior Hip, Fall Precaution Comments: RLE s/p ORIF, posterior hip precautions Required Braces or Orthoses: Knee Immobilizer - Left Splint/Cast - Date Prophylactic Dressing Applied (if applicable):  24/09/73 Other Brace: extension brace L LE, resting hand splint to R UE (ulnar nerve palsy) Restrictions Weight Bearing Restrictions: Yes RUE Weight Bearing: Weight bearing as tolerated RLE Weight Bearing: Touchdown weight bearing LLE Weight Bearing: Weight bearing as tolerated Other Position/Activity Restrictions: posterior precautiosn R hip General:   Vital Signs: Therapy Vitals Temp: 98.5 F (36.9 C) Pulse Rate: (!) 104 Resp: 18 BP: 117/83 Patient Position (if appropriate): Lying Oxygen Therapy SpO2: 100 % O2 Device: Room Air Pain: Pt with no c/o pain  Therapy/Group: Individual Therapy  Simonne Come 05/12/2019, 3:52 PM

## 2019-05-12 NOTE — Progress Notes (Signed)
Physical Therapy Session Note  Patient Details  Name: Joseph Stokes MRN: [id number] Date of Birth: 10-02-1990  Today's Date: 05/12/2019 PT Individual Time: 1300-1401 PT Individual Time Calculation (min): 61 min   Short Term Goals: Week 1:  PT Short Term Goal 1 (Week 1): Pt will transfer bed<>chair w/ min assist PT Short Term Goal 2 (Week 1): Pt will self-propel w/c 150' w/ supervision PT Short Term Goal 3 (Week 1): Pt will perform w/c parts management w/ min cues PT Short Term Goal 4 (Week 1): Pt will perform bed mobility w/ mod assist  Skilled Therapeutic Interventions/Progress Updates:   Received pt supine in bed, pt agreeable to PT and reported pain 7/10 in buttocks due to discomfort from drain; repositioning done to reduce pain. Session focused on ambulation, UE/LE strength, balance/coordination, and improved tolerance to activity. Pt rolled to L side min A for LE management with HOB elevated. Pt transferred supine<>sit EOB mod A for bilateral LE management. Pt was instructed to avoid using trapeze, as he will not have one at home. Pt transferred squat<>pivot x2 trials during session min A for LE management of L knee immobilizer. Pt performed WC mobility 11ft x 2 trials throughout session with supervision. Pt ambulated 32ft x 1 and 41ft x 2 with RW min A +2 for WC follow while maintaining RLE WB precautions. Pt performed standing dynamic balance playing cornhole x5 trials CGA using RW for bilateral UE support. While standing with bilateral UE support on RW, pt performed 4x10 RLE toe taps CGA while maintaining RLE WB precautions. While standing with bilateral UE support on RW, pt performed 2x10 RLE marches with verbal cues for increased hip flexion. Concluded session with pt sitting in WC, needs within reach, and seatbelt alarm on.   Therapy Documentation Precautions:  Precautions Precautions: Posterior Hip, Fall Precaution Comments: RLE s/p ORIF, posterior hip precautions Required  Braces or Orthoses: Knee Immobilizer - Left(locked in extension) Splint/Cast - Date Prophylactic Dressing Applied (if applicable): 25/05/39 Other Brace: extension brace L LE, resting hand splint to R UE (ulnar nerve palsy) Restrictions Weight Bearing Restrictions: Yes RUE Weight Bearing: Weight bearing as tolerated RLE Weight Bearing: Touchdown weight bearing LLE Weight Bearing: Weight bearing as tolerated Other Position/Activity Restrictions: posterior precautiosn R hip   Therapy/Group: Individual Therapy  Alfonse Alpers PT, DPT   05/12/2019, 12:33 PM

## 2019-05-12 NOTE — Progress Notes (Signed)
Physical Therapy Session Note  Patient Details  Name: Joseph Stokes MRN: [id number] Date of Birth: 09-May-1991  Today's Date: 05/12/2019 PT Individual Time: 1445-1525 PT Individual Time Calculation (min): 40 min   Short Term Goals: Week 1:  PT Short Term Goal 1 (Week 1): Pt will transfer bed<>chair w/ min assist PT Short Term Goal 2 (Week 1): Pt will self-propel w/c 150' w/ supervision PT Short Term Goal 3 (Week 1): Pt will perform w/c parts management w/ min cues PT Short Term Goal 4 (Week 1): Pt will perform bed mobility w/ mod assist  Skilled Therapeutic Interventions/Progress Updates:   Pt in supine and agreeable to therapy, denies pain but reports increased fatigue. Supervision bed mobility w/ use of trapeze bar and min assist stand pivot to w/c using RW and verbal cues for TDWB on RLE. Total assist w/c transport to therapy gym for time management. BLE strengthening and ROM exercises including R heel slides 2x10, R resisted heel slides 2x10 w/ green TB, R LAQ 3x10, R heel raise 2x10, and L active assist SLR 3x5. Pt self-propelled w/c back to room w/ BUEs and multiple but brief rest breaks 2/2 UE fatigue. Stand pivot to EOB w/ min assist and mod assist sit>supine. Pt very tearful at end of session regarding loss of his girlfriend and unborn baby. Provided therapeutic listening and emotional support. Encouraged pt to reach back out to chaplain and will made RN aware as well. Ended session in supine, all needs in reach.   Therapy Documentation Precautions:  Precautions Precautions: Posterior Hip, Fall Precaution Comments: RLE s/p ORIF, posterior hip precautions Required Braces or Orthoses: Knee Immobilizer - Left Splint/Cast - Date Prophylactic Dressing Applied (if applicable): 80/99/83 Other Brace: extension brace L LE, resting hand splint to R UE (ulnar nerve palsy) Restrictions Weight Bearing Restrictions: Yes RUE Weight Bearing: Weight bearing as tolerated RLE Weight Bearing:  Touchdown weight bearing LLE Weight Bearing: Weight bearing as tolerated Other Position/Activity Restrictions: posterior precautiosn R hip Vital Signs: Therapy Vitals Pulse Rate: 100 Pain: Pain Assessment Pain Scale: Faces  Therapy/Group: Individual Therapy  Donnie Panik Clent Demark 05/12/2019, 3:26 PM

## 2019-05-12 NOTE — Progress Notes (Signed)
Pt complained that air mattress was uncomfortable and that he could not get adjusted in it. Pt stated he feels confined in it and anxious. Requesting that his bed be changed. Rationale of air mattress gone over with patient. Pt does not want to use the air mattress. Pt placed back into regular bed.

## 2019-05-12 NOTE — Progress Notes (Signed)
Occupational Therapy Session Note  Patient Details  Name: Kosisochukwu Burningham MRN: [id number] Date of Birth: [birth year]  Today's Date: 05/12/2019 OT Individual Time: 1032-1105 OT Individual Time Calculation (min): 33 min    Short Term Goals: Week 1:  OT Short Term Goal 1 (Week 1): Pt will complete stand pivot transfer to toilet with mod assist OT Short Term Goal 2 (Week 1): Pt will complete LB dressing with mod assist with AE PRN OT Short Term Goal 3 (Week 1): Pt will complete bathing with mod assist with AE PRN  Skilled Therapeutic Interventions/Progress Updates:    Pt completed toilet transfer with min assist using the RW for support.  He was able to complete toilet hygiene and clothing management in standing, adhering to his weightbearing precautions in the RLE.  He was able to sit in the wheelchair with setup for washing his face as well as for washing his hands.  Wheelchair mobility completed with supervision for 73' with need for a rest break secondary to left elbow pain.  He was able to complete functional mobility for approximately 20' with min assist using the RW for support.  HR at rest 115 but increasing into the 150s with activity.  Finished session with transfer back to the bed from the wheelchair with min assist.  Mod assist for sit to supine to complete session.  Call button and phone in reach with safety bed alarm in place.    Therapy Documentation Precautions:  Precautions Precautions: Posterior Hip, Fall Precaution Comments: RLE s/p ORIF, posterior hip precautions Required Braces or Orthoses: Knee Immobilizer - Left Splint/Cast - Date Prophylactic Dressing Applied (if applicable): 16/01/09 Other Brace: extension brace L LE, resting hand splint to R UE (ulnar nerve palsy) Restrictions Weight Bearing Restrictions: Yes RUE Weight Bearing: Weight bearing as tolerated RLE Weight Bearing: Touchdown weight bearing LLE Weight Bearing: Weight bearing as tolerated Other  Position/Activity Restrictions: posterior precautiosn R hip   Vital Signs: Therapy Vitals Pulse Rate: (!) 115 Patient Position (if appropriate): Sitting Pain: Pain Assessment Pain Scale: Faces Faces Pain Scale: Hurts little more Pain Type: Acute pain;Surgical pain Pain Location: Leg Pain Orientation: Right;Left Pain Descriptors / Indicators: Discomfort Pain Onset: With Activity Pain Intervention(s): Repositioned ADL: See Care Tool Section for some details of ADL tasks  Therapy/Group: Individual Therapy  Lucila Klecka OTR/L 05/12/2019, 12:38 PM

## 2019-05-12 NOTE — Progress Notes (Signed)
Pt was assisted to chair by NT's . Upon entering room pt was back in bed, pt stated he did it himself. Pt was instructed to call for assistance. Risks of getting up without assistance reviewed with patient. Pt resistant to policy regarding assistance.

## 2019-05-12 NOTE — Progress Notes (Signed)
[facility] PHYSICAL MEDICINE & REHABILITATION PROGRESS NOTE  Subjective/Complaints: Patient seen laying in bed this AM. He states he slept well overnight.  Per nursing, patient restless in bed.  Patient states he would like family to visit, discussed policy with patient, encouraged video chat.   ROS: Denies CP, SOB, N/V/D  Objective: Vital Signs: Blood pressure 119/81, pulse (!) 101, temperature 98 F (36.7 C), temperature source Oral, resp. rate 16, height 5\' 4"  (1.626 m), weight 75.5 kg, SpO2 100 %. No results found. Recent Labs    05/11/19 1231  WBC 16.3*  HGB 11.6*  HCT 33.2*  PLT PLATELET CLUMPS NOTED ON SMEAR, COUNT APPEARS INCREASED   Recent Labs    05/11/19 1231  NA 137  K 5.5*  CL 104  CO2 20*  GLUCOSE 104*  BUN 12  CREATININE 0.98  CALCIUM 9.1    Physical Exam: BP 119/81 (BP Location: Left Arm)   Pulse (!) 101   Temp 98 F (36.7 C) (Oral)   Resp 16   Ht 5\' 4"  (1.626 m)   Wt 75.5 kg   SpO2 100%   BMI 28.57 kg/m  Constitutional: No distress . Vital signs reviewed. HENT: Normocephalic.  Atraumatic. Eyes: EOMI. No discharge. Cardiovascular: No JVD. Respiratory: Normal effort.  No stridor. GI: Non-distended. Skin: Scattered abrasions and incisions C/D/I Graft site with dressing Psych: Normal mood.  Normal behavior. Musc: Edema and tenderness in right upper extremity and b/l lower extremities Neurological: Alert Motor: RUE: 4/5 proximal to distal (pain inhibition) LUE: 5/5 proximal to distal RLE: HF 2/5, KE 4-/5, ADF 4/5 LLE: HF 3 -/5, KE 2/5, ADF 4/5 Psychiatric: flat but cooperative.   Assessment/Plan: 1. Functional deficits secondary to polytrauma which require 3+ hours per day of interdisciplinary therapy in a comprehensive inpatient rehab setting.  Physiatrist is providing close team supervision and 24 hour management of active medical problems listed below.  Physiatrist and rehab team continue to assess barriers to discharge/monitor patient  progress toward functional and medical goals  Care Tool:  Bathing    Body parts bathed by patient: Right arm, Left arm, Chest, Abdomen, Front perineal area, Face   Body parts bathed by helper: Buttocks, Right upper leg, Left upper leg, Right lower leg Body parts n/a: Left lower leg   Bathing assist Assist Level: Moderate Assistance - Patient 50 - 74%     Upper Body Dressing/Undressing Upper body dressing   What is the patient wearing?: Pull over shirt    Upper body assist Assist Level: Minimal Assistance - Patient > 75%    Lower Body Dressing/Undressing Lower body dressing      What is the patient wearing?: Pants     Lower body assist Assist for lower body dressing: Maximal Assistance - Patient 25 - 49%     Toileting Toileting    Toileting assist Assist for toileting: Maximal Assistance - Patient 25 - 49%     Transfers Chair/bed transfer  Transfers assist  Chair/bed transfer activity did not occur: Safety/medical concerns  Chair/bed transfer assist level: Moderate Assistance - Patient 50 - 74%     Locomotion Ambulation   Ambulation assist   Ambulation activity did not occur: Safety/medical concerns          Walk 10 feet activity   Assist  Walk 10 feet activity did not occur: Safety/medical concerns        Walk 50 feet activity   Assist Walk 50 feet with 2 turns activity did not occur: Safety/medical concerns  Walk 150 feet activity   Assist Walk 150 feet activity did not occur: Safety/medical concerns         Walk 10 feet on uneven surface  activity   Assist Walk 10 feet on uneven surfaces activity did not occur: Safety/medical concerns         Wheelchair     Assist Will patient use wheelchair at discharge?: Yes Type of Wheelchair: Manual    Wheelchair assist level: Supervision/Verbal cueing Max wheelchair distance: 57ft    Wheelchair 50 feet with 2 turns activity    Assist        Assist Level:  Supervision/Verbal cueing, Set up assist   Wheelchair 150 feet activity     Assist Wheelchair 150 feet activity did not occur: Safety/medical concerns          Medical Problem List and Plan: 1.  Deficits with mobility, endurance, self-care secondary to polytrauma.  Continues CIR 2.  Antithrombotics: -DVT/anticoagulation:  Pharmaceutical: Xarelto             -antiplatelet therapy: n/A 3. Pain Management: Oxycodone prn effective at present  Relatively controlled on 10/27 4. Mood/significant grieving given his lost  -pastoral and family support  -has prn ativan ordered  -Increased lexapro to 10mg  qhs, will consider further increase             -antipsychotic agents: N/A 5. Neuropsych: This patient is capable of making decisions on his own behalf. 6. Skin/Wound Care: Added protein supplement to promote healing.  Per ortho--to keep all other wounds open to air  Dressing change for stsg recipient site as follows adaptic, 4x4, kerlix and Ace wrap Change q 2-3 days (next dressing change would be 05/09/2019,we are on call this weekend and will do this) Do notclean wound with any ointments, solutions, solvents.  Donor site instructions   DO NOTremove yellow xeroform layer for any reason  Pt to dab blood droplets as they form  Fan to xeroform layer to dry out site/sites DO NOT cover donor sites with any dressings, allow them to stay exposed to air  Ok to coverwhen usinghinged brace to mobilizeafter patient has mobilized to destination please remove hinged brace and dressing to allow Xeroform to be exposed to air Gently trim edges as they roll  up    TO keep wounds open to air. Dressing changes to graft site every 2-3 days-->next to be done by ortho.  7. Fluids/Electrolytes/Nutrition: Monitor I/O.  8. Pelvic ring fracture/R-acetabular fracture/R-femoral neck fracture s/p ORIF: TDWB RLE with posterior hip precautions.  9. Traumatic left knee injury s/p quad tendon repair: WBAT with LLE brace locked in full extension for 4 weeks.  10. Complex soft tissue injuries LLE skin graft:  To LEAVE yellow xeroform in place and to keep donor site open to air. 11. ABLA:   Hemoglobin 11.6 on 10/26 12. Leucocytosis:              Monitor for injection  Afebrile             WBCs 16.3 on 10/26, labs ordered for tomorrow 13.  H/o anxiety disorder: Currently getting ativan 1 mg tid prn--will resume Lexapro (was using prn PTA)              Ego support 14.  Sinus tachycardia-likely multifactorial given blood loss, pain, endurance, anxiety  ECG reviewed on 10/21, showing same  Slightly improved on 10/27 15.  Transaminitis  LFTs elevated on 10/26, but overall improving 16.  Hypoalbuminemia  Supplement  initiated 17. Hyperkalemia  K+  5.5 on 10/26, labs ordered, may need meds  LOS: 5 days A FACE TO FACE EVALUATION WAS PERFORMED  Nadyne Gariepy Karis Jubanil Amra Shukla 05/12/2019, 9:51 AM

## 2019-05-13 ENCOUNTER — Encounter (HOSPITAL_COMMUNITY): Payer: Medicaid Other | Admitting: Psychology

## 2019-05-13 ENCOUNTER — Inpatient Hospital Stay (HOSPITAL_COMMUNITY): Payer: Medicaid Other

## 2019-05-13 ENCOUNTER — Inpatient Hospital Stay (HOSPITAL_COMMUNITY): Payer: Medicaid Other | Admitting: Physical Therapy

## 2019-05-13 ENCOUNTER — Inpatient Hospital Stay (HOSPITAL_COMMUNITY): Payer: Medicaid Other | Admitting: Occupational Therapy

## 2019-05-13 DIAGNOSIS — G8918 Other acute postprocedural pain: Secondary | ICD-10-CM

## 2019-05-13 LAB — CBC WITH DIFFERENTIAL/PLATELET
Abs Immature Granulocytes: 0.12 10*3/uL — ABNORMAL HIGH (ref 0.00–0.07)
Basophils Absolute: 0.1 10*3/uL (ref 0.0–0.1)
Basophils Relative: 1 %
Eosinophils Absolute: 0.1 10*3/uL (ref 0.0–0.5)
Eosinophils Relative: 1 %
HCT: 33.1 % — ABNORMAL LOW (ref 39.0–52.0)
Hemoglobin: 10.9 g/dL — ABNORMAL LOW (ref 13.0–17.0)
Immature Granulocytes: 1 %
Lymphocytes Relative: 12 %
Lymphs Abs: 1.4 10*3/uL (ref 0.7–4.0)
MCH: 30.8 pg (ref 26.0–34.0)
MCHC: 32.9 g/dL (ref 30.0–36.0)
MCV: 93.5 fL (ref 80.0–100.0)
Monocytes Absolute: 1.1 10*3/uL — ABNORMAL HIGH (ref 0.1–1.0)
Monocytes Relative: 10 %
Neutro Abs: 9 10*3/uL — ABNORMAL HIGH (ref 1.7–7.7)
Neutrophils Relative %: 75 %
Platelets: 608 10*3/uL — ABNORMAL HIGH (ref 150–400)
RBC: 3.54 MIL/uL — ABNORMAL LOW (ref 4.22–5.81)
RDW: 15.9 % — ABNORMAL HIGH (ref 11.5–15.5)
WBC: 11.8 10*3/uL — ABNORMAL HIGH (ref 4.0–10.5)
nRBC: 0 % (ref 0.0–0.2)

## 2019-05-13 NOTE — Progress Notes (Signed)
Occupational Therapy Session Note  Patient Details  Name: Joseph Stokes MRN: [id number] Date of Birth: 1990/10/07  Today's Date: 05/13/2019 OT Individual Time: 4163-8453 OT Individual Time Calculation (min): 60 min    Short Term Goals: Week 1:  OT Short Term Goal 1 (Week 1): Pt will complete stand pivot transfer to toilet with mod assist OT Short Term Goal 2 (Week 1): Pt will complete LB dressing with mod assist with AE PRN OT Short Term Goal 3 (Week 1): Pt will complete bathing with mod assist with AE PRN  Skilled Therapeutic Interventions/Progress Updates:    Treatment session with focus on LB bathing and dressing while problem solving best technique.  Pt received supine in bed reporting desire to wash bottom as he feels that he just does not get clean.  Pt able to complete bed mobility with supervision with increased time while adhering to hip precautions.  Completed sit > stand from EOB and ambulated to sink with RW with CGA.  Pt demonstrating good awareness of and adherence to TDWB during ambulation and bathing in standing.  Educated on various techniques to increase success when washing perineal area and buttocks to ensure increased cleansing, pt still requiring assistance for thoroughness.  Pt continues to require assistance with threading underwear and pants, attempting to utilize reacher but still unable to thread clothing over feet - once over feet pt can pull up to knees with reacher and then pull up in standing.  Therapist assisted with donning knee extension brace prior to exiting bed and then doffing post bathing to place on top of pant leg for increased comfort.  Pt returned to bed with CGA stand pivot with RW, demonstrating increased positioning with stand > sit.  Pt attempted to position in bed without assistance, however did require assist to advance BLE in to bed.    Engaged in lengthy discussion regarding d/c plan and destination as that continues to be undecided.  Pt plans  to speak with various family members today to attempt to finalize d/c location.    Therapy Documentation Precautions:  Precautions Precautions: Posterior Hip, Fall Precaution Comments: RLE s/p ORIF, posterior hip precautions Required Braces or Orthoses: Knee Immobilizer - Left Splint/Cast - Date Prophylactic Dressing Applied (if applicable): 64/68/03 Other Brace: extension brace L LE, resting hand splint to R UE (ulnar nerve palsy) Restrictions Weight Bearing Restrictions: Yes RUE Weight Bearing: Weight bearing as tolerated RLE Weight Bearing: Touchdown weight bearing LLE Weight Bearing: Weight bearing as tolerated Other Position/Activity Restrictions: posterior precautiosn R hip Pain: Pain Assessment Pain Scale: 0-10 Pain Score: 0-No pain   Therapy/Group: Individual Therapy  Iris Tatsch, [place] 05/13/2019, 12:00 PM

## 2019-05-13 NOTE — Progress Notes (Addendum)
Physical Therapy Session Note  Patient Details  Name: Joseph Stokes MRN: [id number] Date of Birth: [birth year]  Today's Date: 05/13/2019 PT Individual Time: 1305-1400 PT Individual Time Calculation (min): 55 min   Short Term Goals: Week 1:  PT Short Term Goal 1 (Week 1): Pt will transfer bed<>chair w/ min assist PT Short Term Goal 2 (Week 1): Pt will self-propel w/c 150' w/ supervision PT Short Term Goal 3 (Week 1): Pt will perform w/c parts management w/ min cues PT Short Term Goal 4 (Week 1): Pt will perform bed mobility w/ mod assist  Skilled Therapeutic Interventions/Progress Updates:  Pt received upright in wheelchair, agreeable to therapy. Pt self propelled chair about 75 feet with supervision from room>hallway. Pt transported in wheelchair the remaining distance to the therapy gym. Pt ambulated 34 feetx1 with RW and CGA and W/C follow. Then, pt ambulated 120 feetx3 with RW and CGA and W/C follow including 1 trial of gait w/o obstacles and 2 trials of gait with stepping up and over 2 hockey sticks. Pt required demonstration and verbal cueing for AD management when facing obstacles during ambulation. Pt transported in wheelchair from hallway>day room. Pt completed the following standing exercises with RW and CGA: Standing R hip ABD, standing R hamstring curls, standing R hip extensions. (all 3x10). Pt returned to wheelchair and self propelled chair about 75 feet with supervision. Pt transported back to his room the remaining distance in his wheelchair. Pt left sitting upright, call bell within reach, and safety belt in place. All needs met at this time. Pt's precautions were observed during this session including TDWB and posterior hip precautions.     Therapy Documentation Precautions:  Precautions Precautions: Posterior Hip, Fall Precaution Comments: RLE s/p ORIF, posterior hip precautions Required Braces or Orthoses: Knee Immobilizer - Left Splint/Cast - Date Prophylactic Dressing  Applied (if applicable): 02/63/78 Other Brace: extension brace L LE, resting hand splint to R UE (ulnar nerve palsy) Restrictions Weight Bearing Restrictions: Yes RUE Weight Bearing: Weight bearing as tolerated RLE Weight Bearing: Touchdown weight bearing LLE Weight Bearing: Weight bearing as tolerated Other Position/Activity Restrictions: posterior precautiosn R hip  Pain: denies pain at rest   Therapy/Group: Individual Therapy  Olena Leatherwood, SPT  05/13/2019, 4:44 PM

## 2019-05-13 NOTE — Progress Notes (Addendum)
Patient requested that his other night time medication be given at midnight. Meds held to give at that time. He states that he is in no pain

## 2019-05-13 NOTE — Progress Notes (Signed)
Physical Therapy Session Note  Patient Details  Name: Joseph Stokes MRN: [id number] Date of Birth: 1990-08-21  Today's Date: 05/13/2019 PT Individual Time: 1015-1125 PT Individual Time Calculation (min): 70 min   Short Term Goals: Week 1:  PT Short Term Goal 1 (Week 1): Pt will transfer bed<>chair w/ min assist PT Short Term Goal 2 (Week 1): Pt will self-propel w/c 150' w/ supervision PT Short Term Goal 3 (Week 1): Pt will perform w/c parts management w/ min cues PT Short Term Goal 4 (Week 1): Pt will perform bed mobility w/ mod assist  Skilled Therapeutic Interventions/Progress Updates:   Received pt supine in bed, pt agreeable to PT and denied any pain during today's session. Session focused on functional mobility/transfers, bed mobility, car transfers, ambulation, LE strength, and improved endurance. Pt transferred bed<>WC squat<>pivot without AD min A. Pt performed WC mobility 6ft x 1 and 75 x 1 both with supervision throughout today's session. In rehab apartment, pt transferred WC<>bed stand<>pivot with RW min A. Pt performed sit<>supine and supine<>sit mod A for LE management, and rolling min A to R and supervision to L. Pt performed car transfer with RW mod A for LE management while maintaining WB precautions with mod verbal cues for scooting hips and LE placement in car. Pt emotional at points during session stating "I dont know how I'm going to deal with reality" referring to life after discharge. Pt encouraged to remain mobile, to make healthy life decisions, and to allow time for injuries to heal. Pt discussed desire to make better life choices, return to school, and move to [place] once healed to start fresh. Pt ambulated 15ft with RW while maintaining precautions +2 for WC follow otherwise CGA/min A. Standing at parallel bars pt performed 3x12 RLE hip flexion, knee flexion, and hip abduction with bilateral UE support CGA while maintaining WB precautions. Concluded session with  pt sitting in WC, needs within reach, seatbelt alarm on.   Therapy Documentation Precautions:  Precautions Precautions: Posterior Hip, Fall Precaution Comments: RLE s/p ORIF, posterior hip precautions Required Braces or Orthoses: Knee Immobilizer - Left Splint/Cast - Date Prophylactic Dressing Applied (if applicable): 23/53/61 Other Brace: extension brace L LE, resting hand splint to R UE (ulnar nerve palsy) Restrictions Weight Bearing Restrictions: Yes RUE Weight Bearing: Weight bearing as tolerated RLE Weight Bearing: Touchdown weight bearing LLE Weight Bearing: Weight bearing as tolerated Other Position/Activity Restrictions: posterior precautiosn R hip   Therapy/Group: Individual Therapy  Alfonse Alpers PT, DPT   05/13/2019, 7:42 AM
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
## 2019-05-14 ENCOUNTER — Inpatient Hospital Stay (HOSPITAL_COMMUNITY): Payer: Medicaid Other

## 2019-05-14 DIAGNOSIS — E871 Hypo-osmolality and hyponatremia: Secondary | ICD-10-CM

## 2019-05-14 NOTE — Progress Notes (Signed)
Physical Therapy Session Note  Patient Details  Name: Joseph Stokes MRN: [id number] Date of Birth: 12-22-1990  Today's Date: 05/14/2019 PT Individual Time: 1400-1501 PT Individual Time Calculation (min): 61 min   Short Term Goals: Week 1:  PT Short Term Goal 1 (Week 1): Pt will transfer bed<>chair w/ min assist PT Short Term Goal 2 (Week 1): Pt will self-propel w/c 150' w/ supervision PT Short Term Goal 3 (Week 1): Pt will perform w/c parts management w/ min cues PT Short Term Goal 4 (Week 1): Pt will perform bed mobility w/ mod assist  Skilled Therapeutic Interventions/Progress Updates:   Received pt supine in bed, pt agreeable to PT and states pain 6/10 in L calf, repositioning and ambulation throughout session reduced pain. Session focused on functional mobility/transfers, ambulation, LE strength, balance, and improved tolerance to activity. Pt donned shirt with min A while supine in bed. PT assisted with LE dressing in bed max A for LE management and max A to doff/dof L knee immobilizer. Pt rolled to R min A for LE management. Pt transferred supine<>sit mod A for LE management. Pt performed sit<>stand min A with verbal cues for RLE WB precautions. Pt ambulated 15ft with RW CGA to bathroom. Pt able to maintain standing balance with RW while urinating with supervision. Pt stood at sink with supervision and washed hands and brushed teeth. Pt performed WC mobility 30ft x 1 and 52ft x1 throughout session with supervision. Pt performed obstacle course consisting of weaving in/out of cones, alternating toe taps, and throwing horseshoes using RW and CGA. Pt requested to return to room stating that the wound in is groin was leaking and he was uncomfortable. Pt ambulated 22ft x 1 and 23ft x 1 with RW CGA while maintaining RLE WB precautions. Concluded session with pt seated in WC, needs within reach, and chair pad alarm on. RN made aware of wound status.   Therapy Documentation Precautions:   Precautions Precautions: Posterior Hip, Fall Precaution Comments: RLE s/p ORIF, posterior hip precautions Required Braces or Orthoses: Knee Immobilizer - Left Splint/Cast - Date Prophylactic Dressing Applied (if applicable): 46/27/03 Other Brace: extension brace L LE, resting hand splint to R UE (ulnar nerve palsy) Restrictions Weight Bearing Restrictions: Yes RUE Weight Bearing: Weight bearing as tolerated RLE Weight Bearing: Touchdown weight bearing LLE Weight Bearing: Weight bearing as tolerated Other Position/Activity Restrictions: posterior precautiosn R hip  Therapy/Group: Individual Therapy  Alfonse Alpers PT, DPT   05/14/2019, 12:56 PM
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
## 2019-05-14 NOTE — Progress Notes (Signed)
Physical Therapy Session Note  Patient Details  Name: Joseph Stokes MRN: [id number] Date of Birth: Feb 23, 1991  Today's Date: 05/14/2019 PT Individual Time: 0800-0855 PT Individual Time Calculation (min): 55 min   Short Term Goals: Week 1:  PT Short Term Goal 1 (Week 1): Pt will transfer bed<>chair w/ min assist PT Short Term Goal 2 (Week 1): Pt will self-propel w/c 150' w/ supervision PT Short Term Goal 3 (Week 1): Pt will perform w/c parts management w/ min cues PT Short Term Goal 4 (Week 1): Pt will perform bed mobility w/ mod assist  Skilled Therapeutic Interventions/Progress Updates:     Patient in bed upon PT arrival. Patient alert and agreeable to PT session. Patient reported 5/10 B LE pain during session, RN made aware and patient declined pain medicine until after session due to medicine making him feel drowsy. PT provided repositioning, rest breaks, and distraction as pain interventions throughout session.  Noted drainage from L wound on distal lateral leg on ACE bandage, RN made aware and called in to changed dressing. PT assisted with rolling and positioning patient during dressing change and applied 2 4" ACE wraps to hold dressing in place. RN and PT provided education on dressing changes and would management for infection prevention. PT educated on signs and symptoms of infection while applying ACE wraps. PT donned L Bledsoe brace locked out to 0 degrees of extension with total A and educated patient on donning technique and where to lock the brace. Patient was receptive to all education.   Therapeutic Activity: Bed Mobility: Patient performed rolling R with min A and supine to sit with CGA with increased time, noted increased pain in R hip when rolling. Provided verbal cues for crossing L UE and LE over to the R to assist with rolling. Transfers: Patient performed sit to/from stand x1 and stand pivot x1 with min A using a RW. Provided verbal cues for maintaining TDWB on R,  patient prefers to have his R foot placed on the ground throughout transfers, but states that he does not put weight through his foot.  Gait Training:  Patient ambulated 18 feet using RW with CGA for safety/balance. Ambulated with step to gait pattern leading with L LE initially, PT cued patient to lead with his R LE for increased UE support to maintain WB precautions on the R.   Therapeutic Exercise: Patient performed the following exercises with verbal and tactile cues for proper technique. -B SLR with 50-25% assist x10  Patient in w/c at end of session with breaks locked, chair alarm set, and all needs within reach. Discussed d/c plan and patient stated he may go to his mother's house, but is still trying to determine a plan with his family.    Therapy Documentation Precautions:  Precautions Precautions: Posterior Hip, Fall Precaution Comments: RLE s/p ORIF, posterior hip precautions Required Braces or Orthoses: Knee Immobilizer - Left Splint/Cast - Date Prophylactic Dressing Applied (if applicable): 40/98/11 Other Brace: extension brace L LE, resting hand splint to R UE (ulnar nerve palsy) Restrictions Weight Bearing Restrictions: Yes RUE Weight Bearing: Weight bearing as tolerated RLE Weight Bearing: Touchdown weight bearing LLE Weight Bearing: Weight bearing as tolerated Other Position/Activity Restrictions: posterior precautiosn R hip    Therapy/Group: Individual Therapy  Logen Heintzelman L Brecklynn Jian PT, DPT  05/14/2019, 12:11 PM

## 2019-05-14 NOTE — Progress Notes (Signed)
Occupational Therapy Session Note  Patient Details  Name: Joseph Stokes MRN: [id number] Date of Birth: [birth year]  Today's Date: 05/14/2019 OT Individual Time: 1191-4782 OT Individual Time Calculation (min): 70 min    Short Term Goals: Week 1:  OT Short Term Goal 1 (Week 1): Pt will complete stand pivot transfer to toilet with mod assist OT Short Term Goal 2 (Week 1): Pt will complete LB dressing with mod assist with AE PRN OT Short Term Goal 3 (Week 1): Pt will complete bathing with mod assist with AE PRN  Skilled Therapeutic Interventions/Progress Updates:    Pt resting in bed upon arrival.  Pt declined bathing/dressing this morning.  Pt stated his LLE was more painful today but he thought it was because he did "a lot of walking" the previous day.  Pt dependent for donning LLE brace.  OT intervention with focus on bed mobility, sit<>stand, functional amb with RW, standing balance, w/c mobility, activity tolerance, and safety awareness to increase independence with BADLs. Supine>sit EOB at supervision.  Sit<>stand and functional amb with RW in room with CGA/supervision.  Pt insists that therapist does not need to have "hands on." Pt propelled w/c to Day Room for standing activities with Wii.  Pt required rest breaks X 3.  Pt insists he is not putting weight through RLE but this is quesitonable.  Pt CGA for all static standing activities.  Pt requested to remain in w/c and agreed to placement of seat alarm.  Pt remained in w/c with seat alarm activated and all needs within reach.   Therapy Documentation Precautions:  Precautions Precautions: Posterior Hip, Fall Precaution Comments: RLE s/p ORIF, posterior hip precautions Required Braces or Orthoses: Knee Immobilizer - Left Splint/Cast - Date Prophylactic Dressing Applied (if applicable): 95/62/13 Other Brace: extension brace L LE, resting hand splint to R UE (ulnar nerve palsy) Restrictions Weight Bearing Restrictions: Yes RUE Weight  Bearing: Weight bearing as tolerated RLE Weight Bearing: Touchdown weight bearing LLE Weight Bearing: Weight bearing as tolerated Other Position/Activity Restrictions: posterior precautiosn R hip    Pain: Pain Assessment Pain Scale: 0-10 Pain Score: 7  Pain Type: Acute pain Pain Location: Leg Pain Orientation: Right;Left Pain Descriptors / Indicators: Discomfort Pain Frequency: Intermittent Pain Intervention(s): premedicated  Therapy/Group: Individual Therapy  Leroy Libman 05/14/2019, 12:15 PM

## 2019-05-14 NOTE — Plan of Care (Signed)
  Problem: RH BOWEL ELIMINATION Goal: RH STG MANAGE BOWEL WITH ASSISTANCE Description: STG Manage Bowel with max Assistance. Outcome: Progressing   Problem: RH BLADDER ELIMINATION Goal: RH STG MANAGE BLADDER WITH ASSISTANCE Description: STG Manage Bladder With mod I Assistance Outcome: Progressing Goal: RH STG MANAGE BLADDER WITH EQUIPMENT WITH ASSISTANCE Description: STG Manage Bladder With Equipment With Assistance Outcome: Progressing   Problem: RH SKIN INTEGRITY Goal: RH STG SKIN FREE OF INFECTION/BREAKDOWN Description: Free of further breakdown and infection with max assist Outcome: Progressing Goal: RH STG MAINTAIN SKIN INTEGRITY WITH ASSISTANCE Description: STG Maintain Skin Integrity With Assistance. Max Outcome: Progressing Goal: RH STG ABLE TO PERFORM INCISION/WOUND CARE W/ASSISTANCE Description: STG Able To Perform Incision/Wound Care With Assistance. Max Outcome: Progressing   Problem: RH SAFETY Goal: RH STG ADHERE TO SAFETY PRECAUTIONS W/ASSISTANCE/DEVICE Description: STG Adhere to Safety Precautions With Assistance/Device. Min Outcome: Progressing   Problem: RH PAIN MANAGEMENT Goal: RH STG PAIN MANAGED AT OR BELOW PT'S PAIN GOAL Description: Below 3 Outcome: Progressing   Problem: RH KNOWLEDGE DEFICIT GENERAL Goal: RH STG INCREASE KNOWLEDGE OF SELF CARE AFTER HOSPITALIZATION Description: Patient will be able to verbalize self care plan including medications and wound care, S/S infection with cues/handouts Outcome: Progressing

## 2019-05-14 NOTE — Progress Notes (Addendum)
Was received in report that patient wanted his pain medications at around 2000 & that the scheduled times have changed because patient wanted to change it. Went into patients room to give meds at approximately 2020 & patient asked if he could tke them later. Asked patient at about what time would he like them & he stated 10pm & that he wasn't really in any pain. He was sitting in his wheelchair at the time. He asked for disposable pants & they were ordered. Staff were able to locate some & it was given to the nurse tech, who stated that the patient was ready to go to bed. When we got to the room, patient was trying to put on the pants on his own. He was able to place them on the left leg but had some trouble with the left & was given assistance. He then stated that he was starting to hurt. He was transfer to the bed by scooting & all of his night time meds were given. Asked patient how he wanted his other medication that was now supposed to be given at around 0200 & he stated that he wanted to wait until the 0600 dose. Yesterday morning, he didn't want that dose until 0800 & it was pushed back as well. Xeroform is still in place to the left lateral thigh & other dressings were done on the previous shift. He refused his colace yesterday & today due to it causing multiple bowel movements. No acute distress noted. Will continue to monitor for changes.

## 2019-05-15 ENCOUNTER — Inpatient Hospital Stay (HOSPITAL_COMMUNITY): Payer: Medicaid Other

## 2019-05-15 ENCOUNTER — Inpatient Hospital Stay (HOSPITAL_COMMUNITY): Payer: Medicaid Other | Admitting: Occupational Therapy

## 2019-05-15 DIAGNOSIS — Z634 Disappearance and death of family member: Secondary | ICD-10-CM

## 2019-05-15 DIAGNOSIS — G479 Sleep disorder, unspecified: Secondary | ICD-10-CM

## 2019-05-15 LAB — BASIC METABOLIC PANEL
Anion gap: 11 (ref 5–15)
BUN: 7 mg/dL (ref 6–20)
CO2: 26 mmol/L (ref 22–32)
Calcium: 9 mg/dL (ref 8.9–10.3)
Chloride: 100 mmol/L (ref 98–111)
Creatinine, Ser: 0.93 mg/dL (ref 0.61–1.24)
GFR calc Af Amer: >60 mL/min (ref >60)
GFR calc non Af Amer: >60 mL/min (ref >60)
Glucose, Bld: 109 mg/dL — ABNORMAL HIGH (ref 70–99)
Potassium: 3.9 mmol/L (ref 3.5–5.1)
Sodium: 137 mmol/L (ref 135–145)

## 2019-05-15 NOTE — Progress Notes (Signed)
Occupational Therapy Session Note  Patient Details  Name: Joseph Stokes MRN: [id number] Date of Birth: April 20, 1991  Today's Date: 05/15/2019 OT Individual Time: 1345-1430 OT Individual Time Calculation (min): 45 min    Short Term Goals: Week 1:  OT Short Term Goal 1 (Week 1): Pt will complete stand pivot transfer to toilet with mod assist OT Short Term Goal 1 - Progress (Week 1): Met OT Short Term Goal 2 (Week 1): Pt will complete LB dressing with mod assist with AE PRN OT Short Term Goal 2 - Progress (Week 1): Met OT Short Term Goal 3 (Week 1): Pt will complete bathing with mod assist with AE PRN OT Short Term Goal 3 - Progress (Week 1): Met Week 2:  OT Short Term Goal 1 (Week 2): STG = LTGs due to remaining LOS  Skilled Therapeutic Interventions/Progress Updates:    OT intervention with focus on bed mobility, w/c mobility and setup, functional amb with RW, standing balance, and RUE manipulation/dexterity. Pt requires assistance donning and doffing LLE brace (min A). Pt required assistance lifting RLE onto bed. Functional amb with RW at supervision level.  Question if pt is maintaining weight bearing precautions. W/c mobility in simulated home envirionment. Pt demonstrated RUE in-hand manipulation tasks WNL. Pt returned to bed and reamined in bed with all needs within reach and bed alarm activated.   Therapy Documentation Precautions:  Precautions Precautions: Posterior Hip, Fall Precaution Comments: RLE s/p ORIF, posterior hip precautions Required Braces or Orthoses: Knee Immobilizer - Left Splint/Cast - Date Prophylactic Dressing Applied (if applicable): 24/46/95 Other Brace: extension brace L LE, resting hand splint to R UE (ulnar nerve palsy) Restrictions Weight Bearing Restrictions: Yes RUE Weight Bearing: Weight bearing as tolerated RLE Weight Bearing: Touchdown weight bearing LLE Weight Bearing: Weight bearing as tolerated Other Position/Activity Restrictions: posterior  precautiosn R hip Pain:  Pt with no c/o pain this afternoon.  Therapy/Group: Individual Therapy  Leroy Libman 05/15/2019, 2:48 PM

## 2019-05-15 NOTE — Progress Notes (Signed)
Physical Therapy Weekly Progress Note  Patient Details  Name: Joseph Stokes MRN: [id number] Date of Birth: [birth year]  Beginning of progress report period: May 08, 2019 End of progress report period: May 15, 2019  Today's Date: 05/15/2019 PT Individual Time: 1045-1200 PT Individual Time Calculation (min): 75 min   Patient has met 4 of 4 short term goals. Despite pt's inability to get comfortable in the hospital, lack of sleep, and discomfort from wounds, pt has made significant progress in therapy demonstrating improved balance with ambulation and transfers, UE/LE strength, and improved activity tolerance. Pt is currently able to perform bed mobility with min A, transfers with RW CGA, ambulation approximately 28f with RW CGA, car transfers mod A, and WC propulsion approximately 1563fwith supervision.  Patient continues to demonstrate the following deficits muscle weakness and decreased standing balance, decreased postural control, decreased balance strategies and difficulty maintaining precautions and therefore will continue to benefit from skilled PT intervention to increase functional independence with mobility.  Patient progressing toward long term goals..  Continue plan of care.  PT Short Term Goals Week 1:  PT Short Term Goal 1 (Week 1): Pt will transfer bed<>chair w/ min assist PT Short Term Goal 2 (Week 1): Pt will self-propel w/c 150' w/ supervision PT Short Term Goal 3 (Week 1): Pt will perform w/c parts management w/ min cues PT Short Term Goal 4 (Week 1): Pt will perform bed mobility w/ mod assist Week 2:  PT Short Term Goal 1 (Week 2): STG=LTG due to remaining length of stay  Skilled Therapeutic Interventions/Progress Updates:   Received pt supine in bed, pt agreeable to PT, and states pain in L calf but did not state number. Repositioning and ambulation done to alleviate pain. Session focused on functional mobility/transfers, bed mobility, ambulation, LE strength,  car transfer, balance/coordination, maintaining WB precautions, and improved tolerance to activity. Pt able to maintain RLE WB precautions throughout session. Pt rolled to R with supervision and transferred supine<>sit EOB min A for LE management. Pt transferred sit<>stand with RW CGA and ambulated 1021fith RW to bathroom CGA. Pt able to maintain standing balance and perform hygiene and LE clothing management with supervision. Pt performed WC mobility 150f3f and 200ft52f throughout session with supervision. Pt performed furniture transfer with RW on couch and recliner both CGA. Pt performed TUG with RW in 1 minute 33 seconds with RW CGA. Pt educated on results of test and encouraged to use RW at home for energy conservation purposes. Pt performed car transfer with RW mod A for LE management. Pt required verbal cues for scooting and hand placement. Pt ambulated 75ft 80f RW CGA while maintaining RLE WB precautions. Pt ambulated 10ft o32feven surfaces (ramp) with RW CGA. Concluded session with pt sitting in WC, needs within reach, and chair pad alarm on.   Therapy Documentation Precautions:  Precautions Precautions: Posterior Hip, Fall Precaution Comments: RLE s/p ORIF, posterior hip precautions Required Braces or Orthoses: Knee Immobilizer - Left Splint/Cast - Date Prophylactic Dressing Applied (if applicable): 10/22/248/88/91Brace: extension brace L LE, resting hand splint to R UE (ulnar nerve palsy) Restrictions Weight Bearing Restrictions: Yes RUE Weight Bearing: Weight bearing as tolerated RLE Weight Bearing: Touchdown weight bearing LLE Weight Bearing: Weight bearing as tolerated Other Position/Activity Restrictions: posterior precautiosn R hip  Therapy/Group: Individual Therapy  Pualani Borah M Alfonse AlpersT   05/15/2019, 7:42 AM
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
## 2019-05-15 NOTE — Progress Notes (Signed)
Occupational Therapy Weekly Progress Note  Patient Details  Name: Joseph Stokes MRN: [id number] Date of Birth: 10-04-1990  Beginning of progress report period: May 08, 2019 End of progress report period: May 15, 2019  Today's Date: 05/15/2019 OT Individual Time: 1856-3149 OT Individual Time Calculation (min): 70 min    Patient has met 3 of 3 short term goals.  Pt is making great progress towards goals.  Pt is currently able to complete sit > stand, stand pivot transfers, and short distance ambulation with RW with CGA. Pt continues to require min cues for TDWB during mobility, but has demonstrated good awareness of WB precautions.  Pt is able to complete toilet transfers and toileting with Min assist, pt continues to feel unclean in perineal area due to drainage and requires assistance for thoroughness.  Pt requires assistance with LB dressing due to hip precautions and knee extension brace, but has demonstrated ability to utilize reacher to assist with dressing tasks.  Pt is limited by distractibility and desire to d/c asap.  Pt has been educated on necessity of therapy to decrease burden of care.  Patient continues to demonstrate the following deficits: muscle weakness and decreased standing balance, decreased balance strategies and difficulty maintaining precautions and therefore will continue to benefit from skilled OT intervention to enhance overall performance with BADL and Reduce care partner burden.  Patient progressing toward long term goals..  Continue plan of care.  OT Short Term Goals Week 1:  OT Short Term Goal 1 (Week 1): Pt will complete stand pivot transfer to toilet with mod assist OT Short Term Goal 1 - Progress (Week 1): Met OT Short Term Goal 2 (Week 1): Pt will complete LB dressing with mod assist with AE PRN OT Short Term Goal 2 - Progress (Week 1): Met OT Short Term Goal 3 (Week 1): Pt will complete bathing with mod assist with AE PRN OT Short Term Goal 3 -  Progress (Week 1): Met Week 2:  OT Short Term Goal 1 (Week 2): STG = LTGs due to remaining LOS  Skilled Therapeutic Interventions/Progress Updates:    Treatment session with focus on self-care retraining, functional transfers, and d/c planning.  Pt reporting desire to d/c home asap, therefore engaged in discussion regarding pt goals and progress towards goals and focus of remaining therapy sessions to prepare for d/c.  Pt also continues to be unsure of where he will be staying after d/c, encouraged pt to solidify plans to allow for planning from medical and therapy.  Pt completed bed mobility supervision with increased time.  Sit > stand and short distance ambulation with RW CGA to sink.  Pt completed oral care and UB bathing in standing with supervision.  Pt washed buttocks and perineal area in standing, therapist assisting to ensure thoroughness as pt reports never feeling fully clean.  Pt demonstrating good standing balance while pullings pants up and down over hips, however declined changing pants this session.  Pt continues to require max-total assist to donn knee extension brace.  Pt remained upright in w/c with chair alarm on and all needs in reach.  Therapy Documentation Precautions:  Precautions Precautions: Posterior Hip, Fall Precaution Comments: RLE s/p ORIF, posterior hip precautions Required Braces or Orthoses: Knee Immobilizer - Left Splint/Cast - Date Prophylactic Dressing Applied (if applicable): 70/26/37 Other Brace: extension brace L LE, resting hand splint to R UE (ulnar nerve palsy) Restrictions Weight Bearing Restrictions: Yes RUE Weight Bearing: Weight bearing as tolerated RLE Weight Bearing: Touchdown weight bearing  LLE Weight Bearing: Weight bearing as tolerated Other Position/Activity Restrictions: posterior precautiosn R hip Pain: Pt with c/o pain in Lt calf in standing, premedicated.  Therapy/Group: Individual Therapy  Simonne Come 05/15/2019, 12:25 PM
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
## 2019-05-15 NOTE — Progress Notes (Signed)
Left calf donor site: Dressing on graft site being soaked with serous drainage 2-3 times a day. On exam, unable to express any fluid --nontender to touch and no odor noted. Ortho contacted for input.         Sacral wound:Wound on sacrum with copious serosanguineous dressing.  On probing wound found to have 7 cm tract at 7 o'clock and 2 cm tract at 11 o'clock. Will order packing with iodoform guaze for now. Will consult WOC for further input  .   Can cover additional area on left buttock with band aid as dry with small area of hypergranulation tissue.
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
## 2019-05-15 NOTE — Plan of Care (Signed)
  Problem: RH BOWEL ELIMINATION Goal: RH STG MANAGE BOWEL WITH ASSISTANCE Description: STG Manage Bowel with max Assistance. Outcome: Progressing   Problem: RH BLADDER ELIMINATION Goal: RH STG MANAGE BLADDER WITH ASSISTANCE Description: STG Manage Bladder With mod I Assistance Outcome: Progressing Goal: RH STG MANAGE BLADDER WITH EQUIPMENT WITH ASSISTANCE Description: STG Manage Bladder With Equipment With Assistance Outcome: Progressing   Problem: RH SKIN INTEGRITY Goal: RH STG SKIN FREE OF INFECTION/BREAKDOWN Description: Free of further breakdown and infection with max assist Outcome: Progressing Goal: RH STG MAINTAIN SKIN INTEGRITY WITH ASSISTANCE Description: STG Maintain Skin Integrity With Assistance. Max Outcome: Progressing Goal: RH STG ABLE TO PERFORM INCISION/WOUND CARE W/ASSISTANCE Description: STG Able To Perform Incision/Wound Care With Assistance. Max Outcome: Progressing   Problem: RH SAFETY Goal: RH STG ADHERE TO SAFETY PRECAUTIONS W/ASSISTANCE/DEVICE Description: STG Adhere to Safety Precautions With Assistance/Device. Min Outcome: Progressing   Problem: RH PAIN MANAGEMENT Goal: RH STG PAIN MANAGED AT OR BELOW PT'S PAIN GOAL Description: Below 3 Outcome: Progressing   Problem: RH KNOWLEDGE DEFICIT GENERAL Goal: RH STG INCREASE KNOWLEDGE OF SELF CARE AFTER HOSPITALIZATION Description: Patient will be able to verbalize self care plan including medications and wound care, S/S infection with cues/handouts Outcome: Progressing   
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
## 2019-05-16 ENCOUNTER — Inpatient Hospital Stay (HOSPITAL_COMMUNITY): Payer: Medicaid Other | Admitting: Occupational Therapy
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
## 2019-05-16 NOTE — Progress Notes (Signed)
Occupational Therapy Session Note  Patient Details  Name: Joseph Stokes MRN: [id number] Date of Birth: 1991-05-18  Today's Date: 05/16/2019 OT Individual Time: 7829-5621 OT Individual Time Calculation (min): 57 min   Short Term Goals: Week 2:  OT Short Term Goal 1 (Week 2): STG = LTGs due to remaining LOS  Skilled Therapeutic Interventions/Progress Updates:    Pt greeted in w/c, requesting to use the bathroom. Ambulatory transfer to toilet completed using RW with supervision assist. Pt able to state his R LE weightbearing precautions beforehand. He had continent B+B void. Able to complete perhygiene while standing with supervision assist. Pt then wanted to wash his hands and brush his teeth while standing at the sink vs sitting. Discussed OT goal progress with pt reporting that LB dressing was still difficult. Blocked practice with threading LEs into theraband using reacher to simulate LB dressing tasks. Pt needed a few rest breaks as he described this as "a workout." Discussed importance of using reacher for precaution adherence vs meeting task demands without AE. He also reported increased back discomfort and pain when using the reacher. Guided pt through gentle yoga-based stretches for back and shoulders for relief. At end of session he completed an ambulatory transfer back to bed using device. Assist provided for doffing Lt leg brace. He was left with all needs within reach and bed alarm set.     Therapy Documentation Precautions:  Precautions Precautions: Posterior Hip, Fall Precaution Comments: RLE s/p ORIF, posterior hip precautions Required Braces or Orthoses: Knee Immobilizer - Left Splint/Cast - Date Prophylactic Dressing Applied (if applicable): 30/86/57 Other Brace: extension brace L LE, resting hand splint to R UE (ulnar nerve palsy) Restrictions Weight Bearing Restrictions: Yes RUE Weight Bearing: Weight bearing as tolerated RLE Weight Bearing: Touchdown weight bearing LLE  Weight Bearing: Weight bearing as tolerated Other Position/Activity Restrictions: posterior precautiosn R hip Vital Signs: Therapy Vitals Temp: 98.2 F (36.8 C) Pulse Rate: 96 Resp: 18 BP: 106/62 Patient Position (if appropriate): Lying Oxygen Therapy SpO2: 100 % O2 Device: Room Air Pain: RN provided pain medicine during session    ADL: ADL Grooming: Setup Upper Body Bathing: Contact guard Where Assessed-Upper Body Bathing: Edge of bed Lower Body Bathing: Dependent Where Assessed-Lower Body Bathing: Bed level Upper Body Dressing: Moderate assistance Where Assessed-Upper Body Dressing: Edge of bed Lower Body Dressing: Dependent Where Assessed-Lower Body Dressing: Bed level Toileting: Dependent(+2) Where Assessed-Toileting: Bedside Commode Toilet Transfer: Moderate assistance(+2) Toilet Transfer Method: Stand pivot Toilet Transfer Equipment: Bedside commode      Therapy/Group: Individual Therapy  Haya Hemler A Rosiland Sen 05/16/2019, 4:11 PM

## 2019-05-17 ENCOUNTER — Inpatient Hospital Stay (HOSPITAL_COMMUNITY): Payer: Medicaid Other
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
## 2019-05-17 NOTE — Brief Op Note (Signed)
04/28/2019  5:34 PM  [id number] but needs edit with addition of right thigh exploration, left quad repair, and I&D of open arthrotomy; [facility] intraop consult

## 2019-05-17 NOTE — Progress Notes (Signed)
Physical Therapy Session Note  Patient Details  Name: Joseph Stokes MRN: [id number] Date of Birth: 01-Sep-1990  Today's Date: 05/17/2019 PT Individual Time: 1435-1530 PT Individual Time Calculation (min): 55 min   Short Term Goals: Week 2:  PT Short Term Goal 1 (Week 2): STG=LTG due to remaining length of stay  Skilled Therapeutic Interventions/Progress Updates:     Session 1: Patient in w/c in room upon PT arrival. Patient alert and agreeable to PT session. Patient reported 2-3/10 sharp/tender lower abdominal pain during session, MD made aware during rounding. PT provided repositioning, rest breaks, and distraction as pain interventions throughout session. Discussed d/c plan with patient during session, and patient now states that he is going to his uncle's house in [place], [place] directly from the hospital. L Bledsoe brace donned at beginning of session. PT readjusted brace with total A for proper alignment and educated the patient on brace placement and locking brace out at 0 degrees extension.   Therapeutic Activity: Bed Mobility: Patient performed sit to supine with supervision in a flat bed without use of bed rails with cues for using a gait belt for lifting his R LE as needed.  Transfers: Patient performed sit to/from stand x3 with close supervision for safety using RW. Patient pushed up with B UEs and walked L LE back to stand with knee in extension and TDWB on R to maintain precautions.   Gait Training:  Patient ambulated 75 feet and 15 feet using RW with close supervision for safety/balance. Ambulated with step-to gait pattern with TDWB on R, L knee locked in extension in Bledsoe brace, and increased UE use to maintain WB precautions. Provided verbal cues for shoulder depression when pushing through UEs. Patient reported mild R hypothenar pain at end of ambulation. PT educated on potential for overuse injuries of UEs with gait and w/c mobility due to LE WB precautions and provided  education on energy conservation techniques to avoid overuse injuries. PT also provided w/c gloves during session.  He went up/down 6-3" steps with B rails, ascending backwards and descending forwards, performing a hop-to gait pattern on L to maintain R TDWB. He then went up/down 4-6" steps with B rails using the same technique. Required CGA for safety/balance throughout. PT demonstrated use of RW to ascend 4-6" step, ascending backwards, and descending forwards, as a technique to use if the patient does not have access to rails at his uncle's house. Patient agreed to discuss his uncle's home set up with his uncle later today to assist with d/c planning.   Wheelchair Mobility:  Patient propelled wheelchair ~150 feet and 65 feet with set-up assit. Provided verbal cues for donning/doffing leg rest while maintaining R hip precautions. Patient able to don/doff leg rests with mod A and able to instruct PT on how to don/doff leg rest in preparation for teaching family members at d/c.  Patient in w/c in room at end of session with breaks locked, chair alarm set, and all needs within reach. Maintained R LE TDWB with all mobility throughout session without cueing.   Session 2: Patient in w/c in room upon PT arrival. Patient alert and agreeable to PT session. Patient denied pain throughout session. He expressed frustration with his family at beginning of session. Stated that many different family members were changing plans for him at d/c. PT educated patient on advocating for himself about when he needs at d/c for the best recovery, discussed potential of d/c plans and home set up and ease of access  for home entry and w/c at d/c. Patient felt less frustrated after conversation and stated that he would work with his family on having a plan for d/c tomorrow. Patient also expressed that he feels "like I am in jail" and requested to go outside. PT educated on patient's confidential status and safety risk of taking  patient outside. Patient stated understanding and was motivated to work on stair training to work toward d/c early this week if possible.   Therapeutic Activity: Transfers: Patient performed sit to/from stand x2 with supervision using a RW as above.  Gait Training:  Patient ambulated 120 feet using RW with close supervision for safety/balance. Ambulated with step-to gait pattern leading with R and decreased gait speed with increased use of B UEs. Provided verbal cues for looking ahead and shoulder depression for increased back support with use of UEs. He went up/down 2-6" steps using the RW with CGA ascending backwards and descending forwards using a hop-to gait pattern on L. Patient had decreased L foot clearance on step with RW due to L LE in 0 degrees of extension requiring 2 trials on second step to clear the top of the step. Provided cues for proper technique and manual facilitation with blocking RW on steps.  Wheelchair Mobility:  Patient propelled wheelchair ~150 feet with set up assist for B ELRs.   Patient in w/c at end of session with breaks locked, chair alarm set, and all needs within reach. Patient reported increased R LE pain at night due to [facility] boot. PT educated on proper positioning and demonstrated propping LE on a pillow with toes directed to the ceiling to reduce stretch at the hip and internal rotation at the hip at night.    Therapy Documentation Precautions:  Precautions Precautions: Posterior Hip, Fall Precaution Comments: RLE s/p ORIF, posterior hip precautions Required Braces or Orthoses: Knee Immobilizer - Left Splint/Cast - Date Prophylactic Dressing Applied (if applicable): 05/07/19 Other Brace: extension brace L LE, resting hand splint to R UE (ulnar nerve palsy) Restrictions Weight Bearing Restrictions: Yes RUE Weight Bearing: Weight bearing as tolerated RLE Weight Bearing: Touchdown weight bearing LLE Weight Bearing: Weight bearing as tolerated Other  Position/Activity Restrictions: posterior precautiosn R hip    Therapy/Group: Individual Therapy  Nykolas Bacallao L Juliauna Stueve PT, DPT  05/17/2019, 4:09 PM

## 2019-05-18 ENCOUNTER — Encounter (HOSPITAL_COMMUNITY): Payer: Medicaid Other | Admitting: Psychology

## 2019-05-18 ENCOUNTER — Inpatient Hospital Stay (HOSPITAL_COMMUNITY): Payer: Medicaid Other

## 2019-05-18 ENCOUNTER — Inpatient Hospital Stay (HOSPITAL_COMMUNITY): Payer: Medicaid Other | Admitting: Occupational Therapy

## 2019-05-18 LAB — CBC WITH DIFFERENTIAL/PLATELET
Abs Immature Granulocytes: 0.05 10*3/uL (ref 0.00–0.07)
Basophils Absolute: 0.1 10*3/uL (ref 0.0–0.1)
Basophils Relative: 1 %
Eosinophils Absolute: 0.2 10*3/uL (ref 0.0–0.5)
Eosinophils Relative: 1 %
HCT: 40.1 % (ref 39.0–52.0)
Hemoglobin: 12.4 g/dL — ABNORMAL LOW (ref 13.0–17.0)
Immature Granulocytes: 0 %
Lymphocytes Relative: 21 %
Lymphs Abs: 2.5 10*3/uL (ref 0.7–4.0)
MCH: 30.3 pg (ref 26.0–34.0)
MCHC: 30.9 g/dL (ref 30.0–36.0)
MCV: 98 fL (ref 80.0–100.0)
Monocytes Absolute: 1 10*3/uL (ref 0.1–1.0)
Monocytes Relative: 8 %
Neutro Abs: 8.1 10*3/uL — ABNORMAL HIGH (ref 1.7–7.7)
Neutrophils Relative %: 69 %
Platelets: 589 10*3/uL — ABNORMAL HIGH (ref 150–400)
RBC: 4.09 MIL/uL — ABNORMAL LOW (ref 4.22–5.81)
RDW: 14.9 % (ref 11.5–15.5)
WBC: 11.8 10*3/uL — ABNORMAL HIGH (ref 4.0–10.5)
nRBC: 0 % (ref 0.0–0.2)

## 2019-05-18 LAB — COMPREHENSIVE METABOLIC PANEL
ALT: 24 U/L (ref 0–44)
AST: 22 U/L (ref 15–41)
Albumin: 3 g/dL — ABNORMAL LOW (ref 3.5–5.0)
Alkaline Phosphatase: 118 U/L (ref 38–126)
Anion gap: 10 (ref 5–15)
BUN: 6 mg/dL (ref 6–20)
CO2: 26 mmol/L (ref 22–32)
Calcium: 9.4 mg/dL (ref 8.9–10.3)
Chloride: 105 mmol/L (ref 98–111)
Creatinine, Ser: 0.91 mg/dL (ref 0.61–1.24)
GFR calc Af Amer: >60 mL/min (ref >60)
GFR calc non Af Amer: >60 mL/min (ref >60)
Glucose, Bld: 101 mg/dL — ABNORMAL HIGH (ref 70–99)
Potassium: 4.5 mmol/L (ref 3.5–5.1)
Sodium: 141 mmol/L (ref 135–145)
Total Bilirubin: 0.3 mg/dL (ref 0.3–1.2)
Total Protein: 6.4 g/dL — ABNORMAL LOW (ref 6.5–8.1)

## 2019-05-18 MED ORDER — METHOCARBAMOL 500 MG PO TABS
1000.0000 mg | ORAL_TABLET | Freq: Three times a day (TID) | ORAL | Status: DC | PRN
  Administered 2019-05-18 – 2019-05-20 (×3): 1000 mg via ORAL
  Filled 2019-05-18 (×2): qty 2

## 2019-05-18 MED ORDER — TRAMADOL HCL 50 MG PO TABS
50.0000 mg | ORAL_TABLET | Freq: Four times a day (QID) | ORAL | Status: DC | PRN
  Administered 2019-05-19 – 2019-05-20 (×2): 50 mg via ORAL
  Filled 2019-05-18 (×2): qty 1
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
## 2019-05-18 NOTE — Progress Notes (Signed)
Physical Therapy Session Note  Patient Details  Name: Joseph Stokes MRN: [id number] Date of Birth: 1990-10-24  Today's Date: 05/18/2019 PT Individual Time: 0800-0900 PT Individual Time Calculation (min): 60 min   Short Term Goals: Week 1:  PT Short Term Goal 1 (Week 1): Pt will transfer bed<>chair w/ min assist PT Short Term Goal 1 - Progress (Week 1): Met PT Short Term Goal 2 (Week 1): Pt will self-propel w/c 150' w/ supervision PT Short Term Goal 2 - Progress (Week 1): Met PT Short Term Goal 3 (Week 1): Pt will perform w/c parts management w/ min cues PT Short Term Goal 3 - Progress (Week 1): Met PT Short Term Goal 4 (Week 1): Pt will perform bed mobility w/ mod assist PT Short Term Goal 4 - Progress (Week 1): Met Week 2:  PT Short Term Goal 1 (Week 2): STG=LTG due to remaining length of stay  Skilled Therapeutic Interventions/Progress Updates:   Received pt sitting in WC, pt agreeable to PT and denied any pain throughout session. Session focused on functional mobility/transfers, ambulation, stair navigation, car transfers, LE strength, balance, and improved tolerance to activity. Pt stated he is still unsure of where he will D/C to. Pt states he is in between staying at his Grandmother's and his Aunt's. His Aunt has a level entry home and his Grandmother has 1 step with a threshold. Pt performed WC propulsion 19f with supervision using bilateral UE's. Pt performed stair navigation x4 steps with bilateral rails CGA with verbal cues for RLE WB precautions and LLE knee flexion precautions. Pt performed curb navigation with threshold x4 trials with RW CGA with verbal cues for step sequence and RW safety. Pt performed car transfer with RW min A using gait belt for LE management. Pt performed seated RLE hip flexion and knee extension x12 reps. Pt performed WC propulsion 154fon uneven surfaces (ramp) with bilateral UE's CGA. Pt ambulated 8534f 1 and [id number] with RW CGA. Concluded session with  pt sitting in WC, [facility] within reach, chair alarm on, and Neuro Psych present for next session.   Therapy Documentation Precautions:  Precautions Precautions: Posterior Hip, Fall Precaution Comments: RLE s/p ORIF, posterior hip precautions Required Braces or Orthoses: Knee Immobilizer - Left Splint/Cast - Date Prophylactic Dressing Applied (if applicable): 10/265/78/46er Brace: extension brace L LE, resting hand splint to R UE (ulnar nerve palsy) Restrictions Weight Bearing Restrictions: Yes RUE Weight Bearing: Weight bearing as tolerated RLE Weight Bearing: Touchdown weight bearing LLE Weight Bearing: Weight bearing as tolerated Other Position/Activity Restrictions: posterior precautiosn R hip  Therapy/Group: Individual Therapy  AnnaAlfonse Alpers DPT   05/18/2019, 7:44 AM
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
## 2019-05-18 NOTE — Progress Notes (Signed)
Social Work Patient ID: Joseph Stokes, male   DOB: 1991-05-29, 28 y.o.   MRN: [id number]  Met with pt to see if he has the place he is going at discharge so home health can be set up. He voiced he should know sometime today. Have ordered the equipment and will set up home health when pt can give worker an address he is going too. Pt has asked MD if he can discharge early. Based upon medical issues and place to go.

## 2019-05-18 NOTE — Progress Notes (Signed)
Occupational Therapy Session Note  Patient Details  Name: Joseph Stokes MRN: [id number] Date of Birth: 1990-07-30  Today's Date: 05/18/2019 OT Individual Time: 6294-7654 OT Individual Time Calculation (min): 70 min    Short Term Goals: Week 2:  OT Short Term Goal 1 (Week 2): STG = LTGs due to remaining LOS  Skilled Therapeutic Interventions/Progress Updates:    Pt resting in bed upon arrival.  Bed mobility-supervision. Pt requested to use toilet and amb with RW to bathroom.  Pt stood at toilet to urinate.  Pt returned to room and returned to w/c.  OT intervention with focus on discharge planning, w/c mobility, standing balance, functional amb with RW, and safety awareness to increase independence with BADLs. Pt propelled w/c from room to Day Room and engaged in standing task with Wii baseball.  Pt completed game with rest break X 1. Pt propelled w/c in obstacle course with focus on safety. Discussed discharge plans.  Pt unsure where he will be discharging to.  Discussed equipment needs.  Pt getting BSC. Pt returned to room and remained seated in w/c with all needs within reach and seat alarm activated.   Therapy Documentation Precautions:  Precautions Precautions: Posterior Hip, Fall Precaution Comments: RLE s/p ORIF, posterior hip precautions Required Braces or Orthoses: Knee Immobilizer - Left Splint/Cast - Date Prophylactic Dressing Applied (if applicable): 65/03/54 Other Brace: extension brace L LE, resting hand splint to R UE (ulnar nerve palsy) Restrictions Weight Bearing Restrictions: Yes RUE Weight Bearing: Weight bearing as tolerated RLE Weight Bearing: Touchdown weight bearing LLE Weight Bearing: Weight bearing as tolerated Other Position/Activity Restrictions: posterior precautiosn R hip Pain: Pain Assessment Pain Scale: 0-10 Pain Score: 0-No pain   Therapy/Group: Individual Therapy  Leroy Libman 05/18/2019, 11:28 AM
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
## 2019-05-18 NOTE — Plan of Care (Signed)
  Problem: RH BOWEL ELIMINATION Goal: RH STG MANAGE BOWEL WITH ASSISTANCE Description: STG Manage Bowel with mod I Assistance. Outcome: Progressing   Problem: RH BLADDER ELIMINATION Goal: RH STG MANAGE BLADDER WITH ASSISTANCE Description: STG Manage Bladder With mod I Assistance Outcome: Progressing Goal: RH STG MANAGE BLADDER WITH EQUIPMENT WITH ASSISTANCE Description: STG Manage Bladder With Equipment With mod I Assistance Outcome: Progressing   Problem: RH SKIN INTEGRITY Goal: RH STG SKIN FREE OF INFECTION/BREAKDOWN Description: Free of further breakdown and infection with min assist Outcome: Progressing Goal: RH STG MAINTAIN SKIN INTEGRITY WITH ASSISTANCE Description: STG Maintain Skin Integrity With Assistance. Min Outcome: Progressing Goal: RH STG ABLE TO PERFORM INCISION/WOUND CARE W/ASSISTANCE Description: STG Able To Perform Incision/Wound Care With Assistance. Min Outcome: Progressing   Problem: RH SAFETY Goal: RH STG ADHERE TO SAFETY PRECAUTIONS W/ASSISTANCE/DEVICE Description: STG Adhere to Safety Precautions With ques and reminders Assistance/Device. Outcome: Progressing   Problem: RH PAIN MANAGEMENT Goal: RH STG PAIN MANAGED AT OR BELOW PT'S PAIN GOAL Description: Below 3 Outcome: Progressing   Problem: RH KNOWLEDGE DEFICIT GENERAL Goal: RH STG INCREASE KNOWLEDGE OF SELF CARE AFTER HOSPITALIZATION Description: Patient will be able to verbalize self care plan including medications and wound care, S/S infection with cues/handouts Outcome: Progressing   Problem: Consults Goal: RH GENERAL PATIENT EDUCATION Description: See Patient Education module for education specifics. Outcome: Progressing
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
## 2019-05-18 NOTE — Progress Notes (Signed)
Occupational Therapy Session Note  Patient Details  Name: Joseph Stokes MRN: [id number] Date of Birth: 1991/04/23  Today's Date: 05/18/2019 OT Individual Time: 1303-1400 OT Individual Time Calculation (min): 57 min    Short Term Goals: Week 2:  OT Short Term Goal 1 (Week 2): STG = LTGs due to remaining LOS  Skilled Therapeutic Interventions/Progress Updates:    Treatment session with focus on LB dressing, functional mobility, and d/c planning.  Pt received upright in bed reporting desire to go home.  Again engaged in discussion as to where pt plans to d/c post CIR.  Pt reports plan to speak with his mother and grandmother, as his mother may have a house to move to in the next couple weeks but may need a place to stay until then.  Pt stating he can stay with any of his "home boys", but understands that he wants to focus on healing and does not want any drama.  Pt completed bed mobility with increased time but no physical assistance.  Ambulated 45' with RW with close supervision and good adherence to TDWB through RLE, incorporating opening doors as will be required for home entry.  Engaged in [place] and undressing with use of reacher, pt still has difficulty threading pants over feet when dressing and undressing due to hip precautions and knee extension brace.  Pt requested to return to bed with supervision and left with all needs in reach.  Therapy Documentation Precautions:  Precautions Precautions: Posterior Hip, Fall Precaution Comments: RLE s/p ORIF, posterior hip precautions Required Braces or Orthoses: Knee Immobilizer - Left Splint/Cast - Date Prophylactic Dressing Applied (if applicable): 19/14/78 Other Brace: extension brace L LE, resting hand splint to R UE (ulnar nerve palsy) Restrictions Weight Bearing Restrictions: Yes RUE Weight Bearing: Weight bearing as tolerated RLE Weight Bearing: Touchdown weight bearing LLE Weight Bearing: Weight bearing as tolerated Other  Position/Activity Restrictions: posterior precautiosn R hip General:   Vital Signs: Therapy Vitals Temp: 98.3 F (36.8 C) Pulse Rate: 89 Resp: 17 BP: 119/74 Patient Position (if appropriate): Sitting Oxygen Therapy SpO2: 100 % O2 Device: Room Air Pain: Pain Assessment Pain Scale: 0-10 Pain Score: 6  Pain Type: Acute pain Patients Stated Pain Goal: 3 Pain Intervention(s): Medication (See eMAR)   Therapy/Group: Individual Therapy  Simonne Come 05/18/2019, 3:18 PM
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
## 2019-05-19 ENCOUNTER — Inpatient Hospital Stay (HOSPITAL_COMMUNITY): Payer: Medicaid Other

## 2019-05-19 ENCOUNTER — Inpatient Hospital Stay (HOSPITAL_COMMUNITY): Payer: Medicaid Other | Admitting: Occupational Therapy
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
## 2019-05-19 NOTE — Progress Notes (Signed)
Occupational Therapy Session Note  Patient Details  Name: Joseph Stokes MRN: [id number] Date of Birth: Dec 29, 1990  Today's Date: 05/19/2019 OT Individual Time: 0347-4259 and 1400-1417 OT Individual Time Calculation (min): 70 min and 17 min (missed 43 mins)   Short Term Goals: Week 2:  OT Short Term Goal 1 (Week 2): STG = LTGs due to remaining LOS  Skilled Therapeutic Interventions/Progress Updates:    1) Treatment session with focus on completing ADL retraining and functional mobility in home environment.  Pt received upright in w/c reporting bathing and dressing with nursing staff prior to session (despite plans to complete during therapy session per discussion yesterday).  Pt reports nursing assisted with threading pants over feet but then he was able to advance over legs and over hips in standing.  Pt propelled w/c to ADL apt Mod I.  Engaged in mobility in home environment at w/c and RW level with Supervision when ambulating, but Mod I for w/c.  Educated on tub/shower transfers with tub bench with pt able to complete with min assist to advance LLE over tub ledge.  Pt completed toilet transfers with BSC over toilet to provide UE support for sit > stand.  Engaged in bed mobility with pt able to advance BLE in and out of bed with increased time.  Returned to room via w/c at Mod I.    2) Treatment session with focus d/c planning.  Pt received supine in bed asleep.  Pt awakened and reports extremely fatigued and requesting to sleep.  Engaged in discussion regarding DME and routine for self-care tasks as well as need for Supervision with mobility and assistance for LB dressing due to hip precautions and extension brace.  Pt verbalizing understanding.  Pt declined any further activity, closing eyes to therapist.  Pt missed 43 mins due to fatigue.  Therapy Documentation Precautions:  Precautions Precautions: Posterior Hip, Fall Precaution Comments: RLE s/p ORIF, posterior hip precautions Required  Braces or Orthoses: Knee Immobilizer - Left Splint/Cast - Date Prophylactic Dressing Applied (if applicable): 56/38/75 Other Brace: extension brace L LE, resting hand splint to R UE (ulnar nerve palsy) Restrictions Weight Bearing Restrictions: Yes RUE Weight Bearing: Weight bearing as tolerated RLE Weight Bearing: Touchdown weight bearing LLE Weight Bearing: Weight bearing as tolerated Other Position/Activity Restrictions: posterior precautions R hip General:   Vital Signs:   Pain:   ADL: ADL Equipment Provided: Reacher, Long-handled sponge Eating: Independent Grooming: Independent Upper Body Bathing: Independent Where Assessed-Upper Body Bathing: Sitting at sink Lower Body Bathing: Minimal assistance Where Assessed-Lower Body Bathing: Sitting at sink, Standing at sink Upper Body Dressing: Setup Where Assessed-Upper Body Dressing: Sitting at sink Lower Body Dressing: Minimal assistance Where Assessed-Lower Body Dressing: Sitting at sink, Standing at sink Toileting: Supervision/safety Where Assessed-Toileting: Glass blower/designer: Close supervision Toilet Transfer Method: Counselling psychologist: Bedside commode Vision Baseline Vision/History: No visual deficits Patient Visual Report: No change from baseline Vision Assessment?: No apparent visual deficits Perception  Perception: Within Functional Limits Praxis Praxis: Intact Exercises:   Other Treatments:     Therapy/Group: Individual Therapy  Simonne Come 05/19/2019, 9:05 AM

## 2019-05-19 NOTE — Progress Notes (Signed)
Occupational Therapy Discharge Summary  Patient Details  Name: Joseph Stokes MRN: [id number] Date of Birth: 07-13-1991   Patient has met 9 of 9 long term goals due to improved activity tolerance, improved balance, ability to compensate for deficits, functional use of  RIGHT upper extremity and improved awareness.  Patient to discharge at overall Supervision mobility and toilet transfers with RW, Min assist for LB dressing due to hip precautions and knee extension brace limiting mobility. level.  Patient's care partner has not been present for hands on family education, however pt is able to verbalize precautions and direct care as needed.  Will require caregiver to provide the necessary physical assistance at discharge, with LB dressing and to don knee extension brace.    Reasons goals not met: N/A  Recommendation:  Patient will benefit from ongoing skilled OT services in home health setting to continue to advance functional skills in the area of BADL and Reduce care partner burden.  Equipment: tub bench and 3 in 1  Reasons for discharge: treatment goals met and discharge from hospital  Patient/family agrees with progress made and goals achieved: Yes  OT Discharge Precautions/Restrictions  Precautions Precautions: Posterior Hip;Fall Precaution Comments: RLE s/p ORIF, posterior hip precautions Required Braces or Orthoses: Knee Immobilizer - Left Restrictions Weight Bearing Restrictions: Yes RUE Weight Bearing: Weight bearing as tolerated RLE Weight Bearing: Touchdown weight bearing LLE Weight Bearing: Weight bearing as tolerated Other Position/Activity Restrictions: posterior precautions R hip Pain Pain Assessment Pain Scale: 0-10 Pain Score: 0-No pain ADL ADL Equipment Provided: Reacher, Long-handled sponge Eating: Independent Grooming: Independent Upper Body Bathing: Independent Where Assessed-Upper Body Bathing: Sitting at sink Lower Body Bathing: Minimal  assistance Where Assessed-Lower Body Bathing: Sitting at sink, Standing at sink Upper Body Dressing: Setup Where Assessed-Upper Body Dressing: Sitting at sink Lower Body Dressing: Minimal assistance Where Assessed-Lower Body Dressing: Sitting at sink, Standing at sink Toileting: Supervision/safety Where Assessed-Toileting: Glass blower/designer: Close supervision Toilet Transfer Method: Counselling psychologist: Bedside commode Vision Baseline Vision/History: No visual deficits Patient Visual Report: No change from baseline Vision Assessment?: No apparent visual deficits Perception  Perception: Within Functional Limits Praxis Praxis: Intact Cognition Overall Cognitive Status: Within Functional Limits for tasks assessed Arousal/Alertness: Awake/alert Orientation Level: Oriented X4 Attention: Selective Selective Attention: Appears intact Memory: Appears intact Immediate Memory Recall: Sock;Blue;Bed Memory Recall Sock: Without Cue Memory Recall Blue: Without Cue Memory Recall Bed: Without Cue Awareness: Appears intact Awareness Impairment: Anticipatory impairment Problem Solving: Appears intact Problem Solving Impairment: Functional complex Safety/Judgment: Appears intact Sensation Sensation Light Touch: Appears Intact Light Touch Impaired Details: Impaired RUE(along ulnar distribution, improved from eval) Coordination Gross Motor Movements are Fluid and Coordinated: No Fine Motor Movements are Fluid and Coordinated: No Finger Nose Finger Test: WNL Extremity/Trunk Assessment RUE Assessment Passive Range of Motion (PROM) Comments: WNL Active Range of Motion (AROM) Comments: WNL proximal, improved finger flexion and extension in digits 4 and 5 General Strength Comments: strength grossly 4/5 LUE Assessment LUE Assessment: Within Functional Limits   Americus Perkey 05/19/2019, 9:05 AM

## 2019-05-19 NOTE — Progress Notes (Signed)
Social Work Patient ID: Joseph Stokes, male   DOB: 04-23-1991, 28 y.o.   MRN: [id number]  Met with team and MD who feel pt will be ready for discharge tomorrow and meet his goals. He has spoken with his Mom and grandmother and is planning on going to his Mom's home tomorrow. Will make discharge arrangements.

## 2019-05-19 NOTE — Progress Notes (Signed)
[facility] PHYSICAL MEDICINE & REHABILITATION PROGRESS NOTE  Subjective/Complaints: Patient seen sitting up in bed this morning.  He states he slept well overnight.  He states he is looking forward to discharge tomorrow.  ROS: Denies CP, SOB, N/V/D  Objective: Vital Signs: Blood pressure 134/79, pulse 100, temperature 98 F (36.7 C), temperature source Oral, resp. rate 16, height 5\' 4"  (1.626 m), weight 73 kg, SpO2 100 %. No results found. Recent Labs    05/18/19 0506  WBC 11.8*  HGB 12.4*  HCT 40.1  PLT 589*   Recent Labs    05/18/19 0506  NA 141  K 4.5  CL 105  CO2 26  GLUCOSE 101*  BUN 6  CREATININE 0.91  CALCIUM 9.4    Physical Exam: BP 134/79 (BP Location: Left Arm)   Pulse 100   Temp 98 F (36.7 C) (Oral)   Resp 16   Ht 5\' 4"  (1.626 m)   Wt 73 kg   SpO2 100%   BMI 27.62 kg/m   Constitutional: No distress . Vital signs reviewed. HENT: Normocephalic.  Atraumatic. Eyes: EOMI. No discharge. Cardiovascular: No JVD. Respiratory: Normal effort.  No stridor. GI: Non-distended. Skin: Scattered abrasions and incisions C/D/I. Graft site dressed Psych: Flat Musc: RUE, B/l LE with edema, minimal tenderness Neurological: Alert Motor: RUE: 4+/5 proximal to distal LUE: 5/5 proximal to distal RLE: HF 3-/5, KE 3+/5, ADF 4/5 LLE: HF 3+/5, KE 4/5, ADF 4/5  Assessment/Plan: 1. Functional deficits secondary to polytrauma which require 3+ hours per day of interdisciplinary therapy in a comprehensive inpatient rehab setting.  Physiatrist is providing close team supervision and 24 hour management of active medical problems listed below.  Physiatrist and rehab team continue to assess barriers to discharge/monitor patient progress toward functional and medical goals  Care Tool:  Bathing  Bathing activity did not occur: Refused Body parts bathed by patient: Right arm, Left arm, Chest, Abdomen, Front perineal area, Face, Right upper leg, Left upper leg   Body parts  bathed by helper: Buttocks Body parts n/a: Left lower leg   Bathing assist Assist Level: Minimal Assistance - Patient > 75%     Upper Body Dressing/Undressing Upper body dressing   What is the patient wearing?: Pull over shirt    Upper body assist Assist Level: Set up assist    Lower Body Dressing/Undressing Lower body dressing      What is the patient wearing?: Pants     Lower body assist Assist for lower body dressing: Minimal Assistance - Patient > 75%     Toileting Toileting    Toileting assist Assist for toileting: Supervision/Verbal cueing     Transfers Chair/bed transfer  Transfers assist  Chair/bed transfer activity did not occur: Safety/medical concerns  Chair/bed transfer assist level: Contact Guard/Touching assist     Locomotion Ambulation   Ambulation assist   Ambulation activity did not occur: Safety/medical concerns  Assist level: Supervision/Verbal cueing Assistive device: Walker-rolling Max distance: 9ft   Walk 10 feet activity   Assist  Walk 10 feet activity did not occur: Safety/medical concerns  Assist level: Supervision/Verbal cueing Assistive device: Walker-rolling   Walk 50 feet activity   Assist Walk 50 feet with 2 turns activity did not occur: Safety/medical concerns  Assist level: Supervision/Verbal cueing Assistive device: Walker-rolling    Walk 150 feet activity   Assist Walk 150 feet activity did not occur: Safety/medical concerns         Walk 10 feet on uneven surface  activity  Assist Walk 10 feet on uneven surfaces activity did not occur: Safety/medical concerns   Assist level: Contact Guard/Touching assist Assistive device: Aeronautical engineer Will patient use wheelchair at discharge?: Yes Type of Wheelchair: Manual    Wheelchair assist level: Supervision/Verbal cueing Max wheelchair distance: 166ft    Wheelchair 50 feet with 2 turns activity    Assist         Assist Level: Supervision/Verbal cueing   Wheelchair 150 feet activity     Assist Wheelchair 150 feet activity did not occur: Safety/medical concerns   Assist Level: Supervision/Verbal cueing      Medical Problem List and Plan: 1.  Deficits with mobility, endurance, self-care secondary to polytrauma.  Cont CIR 2.  Antithrombotics: -DVT/anticoagulation:  Pharmaceutical: Xarelto             -antiplatelet therapy: n/A 3. Pain Management: Oxycodone prn effective at present  Methocarbamol and tramadol changed to as needed on 11/2  Relatively controlled on 11/3 4. Mood/significant grieving given his lost  -pastoral and family support  -has prn ativan ordered  -Increased lexapro to 10mg  qhs on 10/25, will consider further increase, however patient reluctant  Cont team support  Psych consulted for homicidal ideation -continue current recs  Discussed with neuropsych-appreciate recs             -antipsychotic agents: N/A 5. Neuropsych: This patient is capable of making decisions on his own behalf. 6. Skin/Wound Care: Added protein supplement to promote healing.  Per ortho--to keep all other wounds open to air  Dressing change for stsg recipient site as follows adaptic, 4x4, kerlix and Ace wrap Change q 2-3 days (next dressing change would be 05/09/2019,we are on call this weekend and will do this) Do notclean wound with any ointments, solutions, solvents.  Donor site instructions   DO NOTremove yellow xeroform layer for any reason  Pt to dab blood droplets as they form  Fan to xeroform layer to dry out site/sites DO NOT cover donor sites with any dressings, allow them to stay exposed to air   Ok to coverwhen usinghinged brace to mobilizeafter patient has mobilized to destination please remove hinged brace and dressing to allow Xeroform to be exposed to air Gently trim edges as they roll up    TO keep wounds open to air. Dressing changes to graft site every 2-3 days-->next to be done by ortho.  Tunneling wound on back-consulted WOC, continue packing, appreciate surgery recs, cont packing 7. Fluids/Electrolytes/Nutrition: Monitor I/O.  8. Pelvic ring fracture/R-acetabular fracture/R-femoral neck fracture s/p ORIF: TDWB RLE with posterior hip precautions.  9. Traumatic left knee injury s/p quad tendon repair: WBAT with LLE brace locked in full extension for 4 weeks.  10. Complex soft tissue injuries LLE skin graft:  To LEAVE yellow xeroform in place and to keep donor site open to air. 11. ABLA:   Hemoglobin 12.1 on 11/2 12. Leucocytosis:              Monitor for injection-no signs of present  Afebrile             WBCs 11.8 on 11/2 13.  H/o anxiety disorder: Currently getting ativan 1 mg tid prn--will resume Lexapro (was using prn PTA)              Ego support 14.  Sinus tachycardia-likely multifactorial given blood loss, pain, endurance, anxiety  ECG reviewed on 10/21, showing same  Stable/overall improving on 11/3 15.  Transaminitis:  Resolved  LFTs within normal limits on 11/2 16.  Hypoalbuminemia  Supplement initiated  Improving on 11/2 17. Hyperkalemia  K+ 4.5 on 11/2 18. Hyponatremia: Resolved  Na 141 on 11/2  Cont to monitor 19. Sleep disturbance  Continue trazodone per psych   Improving overall  LOS: 12 days A FACE TO FACE EVALUATION WAS PERFORMED   Karis JubaAnil  05/19/2019, 9:02 AM

## 2019-05-19 NOTE — Progress Notes (Signed)
Physical Therapy Discharge Summary  Patient Details  Name: Joseph Stokes MRN: [id number] Date of Birth: 12-13-1990  Today's Date: 05/19/2019 PT Individual Time: 1100-1200 PT Individual Time Calculation (min): 60 min   Patient has met 11 of 11 long term goals due to improved activity tolerance, improved balance, improved postural control, increased strength, decreased pain and improved coordination. Patient to discharge at a wheelchair level Supervision. Patient's family unavailable to attend family education but pt verbalized confidence in family's ability to assist at home to provide the necessary physical assistance at discharge.  All goals met  Recommendation:  Patient will benefit from ongoing skilled PT services in home health setting to continue to advance safe functional mobility, address ongoing impairments in transfers, ambulation, stair navigation, LE strength, balance/coordination, activity tolerance, and minimize fall risk.  Treatment Session Received pt supine in bed, pt agreeable to PT, and denied any pain. Pt having staples removed during beginning of session. While pt remained in bed for staple removal PT educated pt on importance of notifying a change of address for Home Health care upon D/C, pt in agreement. Session focused on functional mobility/transfers, ambulation, stair navigtion, car transfers, LE strength, balance, and improved tolerance to activity. Pt performed bed mobility with supervision and required verbal cues for scooting when transitioning supine<>sit. PT donned L knee immobilizer total assist for time management purposes. Pt transferred bed<>WC squat <>pivot without AD CGA while maintaining RLE TWB precautions. Pt ambulated 83f with RW supervision while mantaining RLE TWB precautions. Pt navigated 4 steps x 1 trial with 1 rail on L ascending/descending sideways and 4 steps x 1 trial with 1 rail on L and RUE support on RW both CGA. Pt performed stair navigation  sideways and verbalized confidence with this task and denied practicing again. Pt performed car transfer with supervision but was advised to use leg lifter if pt felt he was unable to lift the leg independently. Pt verbalized understanding of using leg lifter. Pt verbalized confidence with all tasks involved for D/C home. Concluded session with pt sitting in WC, needs within reach, and chair pad alarm on.   Equipment: WC, RW, elevating legrests  Reasons for discharge: treatment goals met  Patient/family agrees with progress made and goals achieved: Yes  PT Discharge Precautions/Restrictions Precautions Precautions: Posterior Hip;Fall Precaution Comments: RLE s/p ORIF, posterior hip precautions Required Braces or Orthoses: Knee Immobilizer - Left Restrictions Weight Bearing Restrictions: Yes RUE Weight Bearing: Weight bearing as tolerated RLE Weight Bearing: Touchdown weight bearing LLE Weight Bearing: Weight bearing as tolerated Other Position/Activity Restrictions: posterior precautions R hip Cognition Overall Cognitive Status: Within Functional Limits for tasks assessed Arousal/Alertness: Awake/alert Orientation Level: Oriented X4 Memory: Appears intact Awareness: Appears intact Problem Solving: Appears intact Safety/Judgment: Appears intact Sensation Sensation Light Touch: Appears Intact Light Touch Impaired Details: Impaired RUE(along ulnar distribution, improved from eval) Coordination Gross Motor Movements are Fluid and Coordinated: No Fine Motor Movements are Fluid and Coordinated: No Finger Nose Finger Test: WNL Motor  Motor Motor: Within Functional Limits  Mobility Bed Mobility Bed Mobility: Rolling Right;Supine to Sit;Rolling Left Rolling Right: Supervision/verbal cueing Rolling Left: Supervision/Verbal cueing Supine to Sit: Supervision/Verbal cueing Transfers Transfers: Sit to Stand;Stand Pivot Transfers Sit to Stand: Supervision/Verbal cueing Stand to Sit:  Supervision/Verbal cueing Stand Pivot Transfers: Contact Guard/Touching assist Stand Pivot Transfer Details: Verbal cues for precautions/safety Stand Pivot Transfer Details (indicate cue type and reason): Verbal cues for RLE TWB status Transfer (Assistive device): Rolling walker Locomotion  Gait Ambulation: Yes Gait Assistance: Supervision/Verbal  cueing Gait Distance (Feet): 75 Feet Assistive device: Rolling walker Gait Assistance Details: Verbal cues for precautions/safety;Verbal cues for safe use of DME/AE Gait Assistance Details: Verbal cues for RLE TWB status and RW safety Gait Gait: Yes Gait Pattern: Impaired Gait Pattern: Decreased stance time - right;Decreased trunk rotation;Decreased step length - left;Antalgic;Decreased stride length;Poor foot clearance - left Gait velocity: decreased Stairs / Additional Locomotion Stairs: Yes Stairs Assistance: Contact Guard/Touching assist Stair Management Technique: One rail Left Number of Stairs: 4 Height of Stairs: 6 Ramp: Supervision/Verbal cueing(RW) Curb: Contact Guard/Touching assist(RW) Product manager Mobility: Yes Wheelchair Assistance: Chartered loss adjuster: Both upper extremities Wheelchair Parts Management: Supervision/cueing Distance: 180f  Trunk/Postural Assessment  Cervical Assessment Cervical Assessment: Within Functional Limits Thoracic Assessment Thoracic Assessment: Within Functional Limits Lumbar Assessment Lumbar Assessment: Within Functional Limits Postural Control Postural Control: Within Functional Limits  Balance Balance Balance Assessed: Yes Static Sitting Balance Static Sitting - Balance Support: No upper extremity supported Static Sitting - Level of Assistance: 7: Independent Dynamic Sitting Balance Dynamic Sitting - Balance Support: No upper extremity supported Dynamic Sitting - Level of Assistance: 7: Independent Static Standing Balance Static  Standing - Balance Support: Bilateral upper extremity supported Static Standing - Level of Assistance: 5: Stand by assistance(RW) Dynamic Standing Balance Dynamic Standing - Balance Support: Bilateral upper extremity supported Dynamic Standing - Level of Assistance: 5: Stand by assistance(RW) Extremity Assessment  RLE Assessment RLE Assessment: Exceptions to [facility]St [facility] HospitalPassive Range of Motion (PROM) Comments: posterior hip precautions limiting hip motion, otherwise WNL General Strength Comments: Grossly 3+/5 LLE Assessment LLE Assessment: Exceptions to WDo[facility] Sanfern[facility] De CarolinaPassive Range of Motion (PROM) Comments: Limited globally, knee locked into extension brace General Strength Comments: Grossly 3+/5   AAlfonse AlpersPT, DPT  05/19/2019, 11:04 AM

## 2019-05-19 NOTE — Plan of Care (Signed)
  Problem: RH BOWEL ELIMINATION Goal: RH STG MANAGE BOWEL WITH ASSISTANCE Description: STG Manage Bowel with mod I Assistance. Outcome: Progressing   Problem: RH BLADDER ELIMINATION Goal: RH STG MANAGE BLADDER WITH ASSISTANCE Description: STG Manage Bladder With mod I Assistance Outcome: Progressing Goal: RH STG MANAGE BLADDER WITH EQUIPMENT WITH ASSISTANCE Description: STG Manage Bladder With Equipment With mod I Assistance Outcome: Progressing   Problem: RH SKIN INTEGRITY Goal: RH STG SKIN FREE OF INFECTION/BREAKDOWN Description: Free of further breakdown and infection with min assist Outcome: Progressing Goal: RH STG MAINTAIN SKIN INTEGRITY WITH ASSISTANCE Description: STG Maintain Skin Integrity With Assistance. Min Outcome: Progressing Goal: RH STG ABLE TO PERFORM INCISION/WOUND CARE W/ASSISTANCE Description: STG Able To Perform Incision/Wound Care With Assistance. Min Outcome: Progressing   Problem: RH SAFETY Goal: RH STG ADHERE TO SAFETY PRECAUTIONS W/ASSISTANCE/DEVICE Description: STG Adhere to Safety Precautions With ques and reminders Assistance/Device. Outcome: Progressing   Problem: RH PAIN MANAGEMENT Goal: RH STG PAIN MANAGED AT OR BELOW PT'S PAIN GOAL Description: Below 3 Outcome: Progressing   Problem: RH KNOWLEDGE DEFICIT GENERAL Goal: RH STG INCREASE KNOWLEDGE OF SELF CARE AFTER HOSPITALIZATION Description: Patient will be able to verbalize self care plan including medications and wound care, S/S infection with cues/handouts Outcome: Progressing   Problem: Consults Goal: RH GENERAL PATIENT EDUCATION Description: See Patient Education module for education specifics. Outcome: Progressing   

## 2019-05-20 MED ORDER — TAB-A-VITE/IRON PO TABS: 1.0000 | ORAL_TABLET | Freq: Every day | ORAL | 0 refills | Status: AC

## 2019-05-20 MED ORDER — RIVAROXABAN 10 MG PO TABS: 10.0000 mg | ORAL_TABLET | Freq: Every day | ORAL | 0 refills | Status: DC

## 2019-05-20 MED ORDER — "GAUZE PADS & DRESSINGS 3""X4"" PADS": 1.0000 "application " | MEDICATED_PAD | Freq: Two times a day (BID) | 0 refills | Status: DC

## 2019-05-20 MED ORDER — ACETAMINOPHEN 500 MG PO TABS: 1000.0000 mg | ORAL_TABLET | Freq: Four times a day (QID) | ORAL | 0 refills | Status: DC | PRN

## 2019-05-20 MED ORDER — TRAZODONE HCL 50 MG PO TABS: 50.0000 mg | ORAL_TABLET | Freq: Every day | ORAL | 0 refills | Status: DC

## 2019-05-20 MED ORDER — DOCUSATE SODIUM 100 MG PO CAPS: 100.0000 mg | ORAL_CAPSULE | Freq: Two times a day (BID) | ORAL | 0 refills | Status: DC

## 2019-05-20 MED ORDER — ESCITALOPRAM OXALATE 10 MG PO TABS: 10.0000 mg | ORAL_TABLET | ORAL | 0 refills | Status: DC

## 2019-05-20 MED ORDER — ADAPTIC NON-ADHERING DRESSING EX PADS: 1.0000 | MEDICATED_PAD | Freq: Two times a day (BID) | CUTANEOUS | 0 refills | Status: DC

## 2019-05-20 MED ORDER — TRAMADOL HCL 50 MG PO TABS: 50.0000 mg | ORAL_TABLET | Freq: Four times a day (QID) | ORAL | 0 refills | Status: DC | PRN

## 2019-05-20 MED ORDER — METHOCARBAMOL 500 MG PO TABS: 1000.0000 mg | ORAL_TABLET | Freq: Three times a day (TID) | ORAL | 0 refills | Status: DC | PRN

## 2019-05-20 NOTE — Progress Notes (Signed)
Social Work Patient ID: Joseph Stokes, male   DOB: 02-14-1991, 28 y.o.   MRN: [id number] have yet to find a home health agency that can staff or take pt's medicaid. Nursing to train caregiver of dressing changes prior to Dc home today.

## 2019-05-20 NOTE — Progress Notes (Signed)
Discharged to home, accompanied by mom. Discharge instructions given by Pam. Reviewed dressing changes with mom. No questions noted. Taken down via wheelchair.  [place]
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
## 2019-05-22 ENCOUNTER — Telehealth: Payer: Self-pay

## 2019-05-22 NOTE — Telephone Encounter (Signed)
Attempted to call patient for Transitional Care Call no answer, left voicemail for patient to call back.

## 2019-05-28 ENCOUNTER — Other Ambulatory Visit: Payer: Self-pay

## 2019-05-28 ENCOUNTER — Encounter: Payer: Medicaid Other | Attending: Physical Medicine & Rehabilitation | Admitting: Physical Medicine & Rehabilitation

## 2019-05-28 ENCOUNTER — Encounter: Payer: Self-pay | Admitting: Physical Medicine & Rehabilitation

## 2019-05-28 VITALS — BP 112/75 | HR 86 | Temp 97.7°F

## 2019-05-28 DIAGNOSIS — F411 Generalized anxiety disorder: Secondary | ICD-10-CM | POA: Diagnosis present

## 2019-05-28 DIAGNOSIS — G479 Sleep disorder, unspecified: Secondary | ICD-10-CM | POA: Diagnosis not present

## 2019-05-28 DIAGNOSIS — W3400XA Accidental discharge from unspecified firearms or gun, initial encounter: Secondary | ICD-10-CM

## 2019-05-28 DIAGNOSIS — Z7409 Other reduced mobility: Secondary | ICD-10-CM | POA: Diagnosis not present

## 2019-05-28 DIAGNOSIS — G8918 Other acute postprocedural pain: Secondary | ICD-10-CM | POA: Diagnosis present

## 2019-05-28 DIAGNOSIS — R262 Difficulty in walking, not elsewhere classified: Secondary | ICD-10-CM | POA: Insufficient documentation

## 2019-05-28 DIAGNOSIS — F17200 Nicotine dependence, unspecified, uncomplicated: Secondary | ICD-10-CM | POA: Diagnosis not present

## 2019-05-28 DIAGNOSIS — T07XXXA Unspecified multiple injuries, initial encounter: Secondary | ICD-10-CM | POA: Diagnosis not present

## 2019-05-28 DIAGNOSIS — Z9889 Other specified postprocedural states: Secondary | ICD-10-CM | POA: Insufficient documentation
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
## 2019-05-28 NOTE — Addendum Note (Signed)
Addended by: Delice Lesch A on: 05/28/2019 01:12 PM   Modules accepted: Level of Service

## 2019-06-29 ENCOUNTER — Encounter: Payer: Medicaid Other | Attending: Physical Medicine & Rehabilitation | Admitting: Physical Medicine & Rehabilitation

## 2019-06-29 DIAGNOSIS — F411 Generalized anxiety disorder: Secondary | ICD-10-CM | POA: Insufficient documentation

## 2019-06-29 DIAGNOSIS — Z7409 Other reduced mobility: Secondary | ICD-10-CM | POA: Insufficient documentation

## 2019-06-29 DIAGNOSIS — G8918 Other acute postprocedural pain: Secondary | ICD-10-CM | POA: Insufficient documentation

## 2019-06-29 DIAGNOSIS — T07XXXA Unspecified multiple injuries, initial encounter: Secondary | ICD-10-CM | POA: Insufficient documentation

## 2019-06-29 DIAGNOSIS — F17200 Nicotine dependence, unspecified, uncomplicated: Secondary | ICD-10-CM | POA: Insufficient documentation

## 2019-06-29 DIAGNOSIS — R262 Difficulty in walking, not elsewhere classified: Secondary | ICD-10-CM | POA: Insufficient documentation

## 2019-06-29 DIAGNOSIS — Z9889 Other specified postprocedural states: Secondary | ICD-10-CM | POA: Insufficient documentation

## 2019-10-29 NOTE — Patient Instructions (Addendum)
DUE TO COVID-19 ONLY ONE VISITOR IS ALLOWED TO COME WITH YOU AND STAY IN THE WAITING ROOM ONLY DURING PRE OP AND PROCEDURE DAY OF SURGERY. THE 1 VISITOR MAY VISIT WITH YOU AFTER SURGERY IN YOUR PRIVATE ROOM DURING VISITING HOURS ONLY!  YOU NEED TO HAVE A COVID 19 TEST ON: 11/02/19 @ 10:00 am  , THIS TEST MUST BE DONE BEFORE SURGERY, COME  [address], [place] , [address].  [facility]) ONCE YOUR COVID TEST IS COMPLETED, PLEASE BEGIN THE QUARANTINE INSTRUCTIONS AS OUTLINED IN YOUR HANDOUT.                Serigne Kubicek   Your procedure is scheduled on: 11/05/19   Report to [facility] Main  Entrance   Report to admitting at: 6:00  AM     Call this number if you have problems the morning of surgery [phone]    Remember:   BRUSH YOUR TEETH MORNING OF SURGERY AND RINSE YOUR MOUTH OUT, NO CHEWING GUM CANDY OR MINTS.                                You may not have any metal on your body including hair pins and              piercings  Do not wear jewelry, lotions, powders or perfumes, deodorant             Men may shave face and neck.   Do not bring valuables to the hospital. [facility] IS NOT             RESPONSIBLE   FOR VALUABLES.  Contacts, dentures or bridgework may not be worn into surgery.  Leave suitcase in the car. After surgery it may be brought to your room.     Patients discharged the day of surgery will not be allowed to drive home. IF YOU ARE HAVING SURGERY AND GOING HOME THE SAME DAY, YOU MUST HAVE AN ADULT TO DRIVE YOU HOME AND BE WITH YOU FOR 24 HOURS. YOU MAY GO HOME BY TAXI OR UBER OR ORTHERWISE, BUT AN ADULT MUST ACCOMPANY YOU HOME AND STAY WITH YOU FOR 24 HOURS.  Name and phone number of your driver:  Special Instructions: N/A              Please read over the following fact sheets you were given: _____________________________________________________________________             NO SOLID FOOD AFTER MIDNIGHT THE NIGHT PRIOR  TO SURGERY. NOTHING BY MOUTH EXCEPT CLEAR LIQUIDS UNTIL: 5:30 am . PLEASE FINISH ENSURE DRINK PER SURGEON ORDER  WHICH NEEDS TO BE COMPLETED AT: 5:30 am .   CLEAR LIQUID DIET   Foods Allowed                                                                     Foods Excluded  Coffee and tea, regular and decaf                             liquids that you cannot  Plain Jell-O any favor except  red or purple                                           see through such as: Fruit ices (not with fruit pulp)                                     milk, soups, orange juice  Iced Popsicles                                    All solid food Carbonated beverages, regular and diet                                    Cranberry, grape and apple juices Sports drinks like Gatorade Lightly seasoned clear broth or consume(fat free) Sugar, honey syrup  Sample Menu Breakfast                                Lunch                                     Supper Cranberry juice                    Beef broth                            Chicken broth Jell-O                                     Grape juice                           Apple juice Coffee or tea                        Jell-O                                      Popsicle                                                Coffee or tea                        Coffee or tea  _____________________________________________________________________  [facility] - Preparing for Surgery Before surgery, you can play an important role.  Because skin is not sterile, your skin needs to be as free of germs as possible.  You can reduce the number of germs on your skin by washing with CHG (chlorahexidine gluconate) soap before surgery.  CHG is an antiseptic cleaner which kills germs and bonds with the skin to continue killing germs  even after washing. Please DO NOT use if you have an allergy to CHG or antibacterial soaps.  If your skin becomes reddened/irritated stop using the CHG and  inform your nurse when you arrive at Short Stay. Do not shave (including legs and underarms) for at least 48 hours prior to the first CHG shower.  You may shave your face/neck. Please follow these instructions carefully:  1.  Shower with CHG Soap the night before surgery and the  morning of Surgery.  2.  If you choose to wash your hair, wash your hair first as usual with your  normal  shampoo.  3.  After you shampoo, rinse your hair and body thoroughly to remove the  shampoo.                           4.  Use CHG as you would any other liquid soap.  You can apply chg directly  to the skin and wash                       Gently with a scrungie or clean washcloth.  5.  Apply the CHG Soap to your body ONLY FROM THE NECK DOWN.   Do not use on face/ open                           Wound or open sores. Avoid contact with eyes, ears mouth and genitals (private parts).                       Wash face,  Genitals (private parts) with your normal soap.             6.  Wash thoroughly, paying special attention to the area where your surgery  will be performed.  7.  Thoroughly rinse your body with warm water from the neck down.  8.  DO NOT shower/wash with your normal soap after using and rinsing off  the CHG Soap.                9.  Pat yourself dry with a clean towel.            10.  Wear clean pajamas.            11.  Place clean sheets on your bed the night of your first shower and do not  sleep with pets. Day of Surgery : Do not apply any lotions/deodorants the morning of surgery.  Please wear clean clothes to the hospital/surgery center.  FAILURE TO FOLLOW THESE INSTRUCTIONS MAY RESULT IN THE CANCELLATION OF YOUR SURGERY PATIENT SIGNATURE_________________________________  NURSE SIGNATURE__________________________________  ________________________________________________________________________   Adam Phenix  An incentive spirometer is a tool that can help keep your lungs clear and  active. This tool measures how well you are filling your lungs with each breath. Taking long deep breaths may help reverse or decrease the chance of developing breathing (pulmonary) problems (especially infection) following:  A long period of time when you are unable to move or be active. BEFORE THE PROCEDURE   If the spirometer includes an indicator to show your best effort, your nurse or respiratory therapist will set it to a desired goal.  If possible, sit up straight or lean slightly forward. Try not to slouch.  Hold the incentive spirometer in an upright position. INSTRUCTIONS FOR USE  1. Sit on the  edge of your bed if possible, or sit up as far as you can in bed or on a chair. 2. Hold the incentive spirometer in an upright position. 3. Breathe out normally. 4. Place the mouthpiece in your mouth and seal your lips tightly around it. 5. Breathe in slowly and as deeply as possible, raising the piston or the ball toward the top of the column. 6. Hold your breath for 3-5 seconds or for as long as possible. Allow the piston or ball to fall to the bottom of the column. 7. Remove the mouthpiece from your mouth and breathe out normally. 8. Rest for a few seconds and repeat Steps 1 through 7 at least 10 times every 1-2 hours when you are awake. Take your time and take a few normal breaths between deep breaths. 9. The spirometer may include an indicator to show your best effort. Use the indicator as a goal to work toward during each repetition. 10. After each set of 10 deep breaths, practice coughing to be sure your lungs are clear. If you have an incision (the cut made at the time of surgery), support your incision when coughing by placing a pillow or rolled up towels firmly against it. Once you are able to get out of bed, walk around indoors and cough well. You may stop using the incentive spirometer when instructed by your caregiver.  RISKS AND COMPLICATIONS  Take your time so you do not get  dizzy or light-headed.  If you are in pain, you may need to take or ask for pain medication before doing incentive spirometry. It is harder to take a deep breath if you are having pain. AFTER USE  Rest and breathe slowly and easily.  It can be helpful to keep track of a log of your progress. Your caregiver can provide you with a simple table to help with this. If you are using the spirometer at home, follow these instructions: SEEK MEDICAL CARE IF:   You are having difficultly using the spirometer.  You have trouble using the spirometer as often as instructed.  Your pain medication is not giving enough relief while using the spirometer.  You develop fever of 100.5 F (38.1 C) or higher. SEEK IMMEDIATE MEDICAL CARE IF:   You cough up bloody sputum that had not been present before.  You develop fever of 102 F (38.9 C) or greater.  You develop worsening pain at or near the incision site. MAKE SURE YOU:   Understand these instructions.  Will watch your condition.  Will get help right away if you are not doing well or get worse. Document Released: 11/12/2006 Document Revised: 09/24/2011 Document Reviewed: 01/13/2007 [facility] Patient Information 2014 [place], [place].   ________________________________________________________________________

## 2019-10-30 ENCOUNTER — Other Ambulatory Visit: Payer: Self-pay

## 2019-10-30 ENCOUNTER — Encounter (HOSPITAL_COMMUNITY): Payer: Medicaid Other

## 2019-10-30 ENCOUNTER — Encounter (HOSPITAL_COMMUNITY): Payer: Self-pay

## 2019-10-30 ENCOUNTER — Encounter (HOSPITAL_COMMUNITY)
Admission: RE | Admit: 2019-10-30 | Discharge: 2019-10-30 | Disposition: A | Discharge status: Home / Self Care | Payer: Medicaid Other | Source: Ambulatory Visit | Attending: Orthopedic Surgery | Admitting: Orthopedic Surgery

## 2019-10-30 DIAGNOSIS — Z01812 Encounter for preprocedural laboratory examination: Secondary | ICD-10-CM | POA: Insufficient documentation

## 2019-10-30 HISTORY — DX: Other complications of anesthesia, initial encounter: T88.59XA

## 2019-10-30 NOTE — Progress Notes (Signed)
PCP - No PCP Cardiologist -   Chest x-ray -  EKG - 05/06/19. EPIC Stress Test -  ECHO -  Cardiac Cath -   Sleep Study -  CPAP -   Fasting Blood Sugar -  Checks Blood Sugar _____ times a day  Blood Thinner Instructions: Aspirin Instructions: Last Dose:  Anesthesia review:   Patient denies shortness of breath, fever, cough and chest pain at PAT appointment   Patient verbalized understanding of instructions that were given to them at the PAT appointment. Patient was also instructed that they will need to review over the PAT instructions again at home before surgery.

## 2019-11-02 ENCOUNTER — Other Ambulatory Visit (HOSPITAL_COMMUNITY)
Admission: RE | Admit: 2019-11-02 | Discharge: 2019-11-02 | Disposition: A | Discharge status: Home / Self Care | Payer: Medicaid Other | Source: Ambulatory Visit | Attending: Orthopedic Surgery | Admitting: Orthopedic Surgery

## 2019-11-02 ENCOUNTER — Encounter (HOSPITAL_COMMUNITY)
Admission: RE | Admit: 2019-11-02 | Discharge: 2019-11-02 | Disposition: A | Discharge status: Home / Self Care | Payer: Medicaid Other | Source: Ambulatory Visit | Attending: Orthopedic Surgery | Admitting: Orthopedic Surgery

## 2019-11-02 ENCOUNTER — Other Ambulatory Visit: Payer: Self-pay

## 2019-11-02 DIAGNOSIS — Z20822 Contact with and (suspected) exposure to covid-19: Secondary | ICD-10-CM | POA: Diagnosis not present

## 2019-11-02 DIAGNOSIS — Z01812 Encounter for preprocedural laboratory examination: Secondary | ICD-10-CM | POA: Diagnosis not present

## 2019-11-02 LAB — CBC
HCT: 48.7 % (ref 39.0–52.0)
Hemoglobin: 16 g/dL (ref 13.0–17.0)
MCH: 30.6 pg (ref 26.0–34.0)
MCHC: 32.9 g/dL (ref 30.0–36.0)
MCV: 93.1 fL (ref 80.0–100.0)
Platelets: 284 10*3/uL (ref 150–400)
RBC: 5.23 MIL/uL (ref 4.22–5.81)
RDW: 14.1 % (ref 11.5–15.5)
WBC: 7.5 10*3/uL (ref 4.0–10.5)
nRBC: 0 % (ref 0.0–0.2)

## 2019-11-02 LAB — SURGICAL PCR SCREEN
MRSA, PCR: NEGATIVE
Staphylococcus aureus: NEGATIVE

## 2019-11-02 LAB — ABO/RH: ABO/RH(D): B POS

## 2019-11-02 LAB — SARS CORONAVIRUS 2 (TAT 6-24 HRS): SARS Coronavirus 2: NEGATIVE
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
## 2019-11-05 ENCOUNTER — Inpatient Hospital Stay (HOSPITAL_COMMUNITY): Payer: Medicaid Other | Admitting: Certified Registered Nurse Anesthetist

## 2019-11-05 ENCOUNTER — Encounter (HOSPITAL_COMMUNITY): Payer: Self-pay | Admitting: Orthopedic Surgery

## 2019-11-05 ENCOUNTER — Encounter (HOSPITAL_COMMUNITY)
Admission: RE | Disposition: A | Discharge status: Home / Self Care | Payer: Self-pay | Source: Home / Self Care | Attending: Orthopedic Surgery

## 2019-11-05 ENCOUNTER — Other Ambulatory Visit: Payer: Self-pay

## 2019-11-05 ENCOUNTER — Observation Stay (HOSPITAL_COMMUNITY)
Admission: RE | Admit: 2019-11-05 | Discharge: 2019-11-06 | Disposition: A | Discharge status: Home / Self Care | Payer: Medicaid Other | Attending: Orthopedic Surgery | Admitting: Orthopedic Surgery

## 2019-11-05 ENCOUNTER — Observation Stay (HOSPITAL_COMMUNITY): Payer: Medicaid Other

## 2019-11-05 DIAGNOSIS — G479 Sleep disorder, unspecified: Secondary | ICD-10-CM | POA: Insufficient documentation

## 2019-11-05 DIAGNOSIS — E663 Overweight: Secondary | ICD-10-CM | POA: Diagnosis not present

## 2019-11-05 DIAGNOSIS — Z96641 Presence of right artificial hip joint: Secondary | ICD-10-CM | POA: Insufficient documentation

## 2019-11-05 DIAGNOSIS — M25551 Pain in right hip: Secondary | ICD-10-CM | POA: Diagnosis present

## 2019-11-05 DIAGNOSIS — Z96649 Presence of unspecified artificial hip joint: Secondary | ICD-10-CM

## 2019-11-05 DIAGNOSIS — Z1810 Retained metal fragments, unspecified: Secondary | ICD-10-CM | POA: Diagnosis not present

## 2019-11-05 DIAGNOSIS — S72051B Unspecified fracture of head of right femur, initial encounter for open fracture type I or II: Secondary | ICD-10-CM | POA: Diagnosis not present

## 2019-11-05 DIAGNOSIS — F17200 Nicotine dependence, unspecified, uncomplicated: Secondary | ICD-10-CM | POA: Insufficient documentation

## 2019-11-05 DIAGNOSIS — Z6826 Body mass index (BMI) 26.0-26.9, adult: Secondary | ICD-10-CM | POA: Insufficient documentation

## 2019-11-05 DIAGNOSIS — F411 Generalized anxiety disorder: Secondary | ICD-10-CM | POA: Insufficient documentation

## 2019-11-05 DIAGNOSIS — R269 Unspecified abnormalities of gait and mobility: Secondary | ICD-10-CM | POA: Insufficient documentation

## 2019-11-05 HISTORY — PX: TOTAL HIP REVISION: SHX763

## 2019-11-05 LAB — TYPE AND SCREEN
ABO/RH(D): B POS
Antibody Screen: NEGATIVE

## 2019-11-05 SURGERY — TOTAL HIP REVISION
Anesthesia: Monitor Anesthesia Care | Site: Hip | Laterality: Right

## 2019-11-05 MED ORDER — METOCLOPRAMIDE HCL 5 MG PO TABS: 5.0000 mg | ORAL_TABLET | Freq: Three times a day (TID) | ORAL | Status: DC | PRN

## 2019-11-05 MED ORDER — MENTHOL 3 MG MT LOZG: 1.0000 | LOZENGE | OROMUCOSAL | Status: DC | PRN

## 2019-11-05 MED ORDER — MIDAZOLAM HCL 2 MG/2ML IJ SOLN
INTRAMUSCULAR | Status: AC
  Filled 2019-11-05: qty 2

## 2019-11-05 MED ORDER — ACETAMINOPHEN 10 MG/ML IV SOLN: 1000.0000 mg | Freq: Once | INTRAVENOUS | Status: DC | PRN

## 2019-11-05 MED ORDER — PHENYLEPHRINE HCL-NACL 10-0.9 MG/250ML-% IV SOLN
INTRAVENOUS | Status: DC | PRN
  Administered 2019-11-05: 75 ug/min via INTRAVENOUS

## 2019-11-05 MED ORDER — ACETAMINOPHEN 160 MG/5ML PO SOLN: 1000.0000 mg | Freq: Once | ORAL | Status: DC | PRN

## 2019-11-05 MED ORDER — HYDROCODONE-ACETAMINOPHEN 7.5-325 MG PO TABS
1.0000 | ORAL_TABLET | ORAL | Status: DC | PRN
  Administered 2019-11-05: 2 via ORAL
  Administered 2019-11-05: 1 via ORAL
  Administered 2019-11-06: 2 via ORAL
  Filled 2019-11-05: qty 2
  Filled 2019-11-05: qty 1
  Filled 2019-11-05: qty 2

## 2019-11-05 MED ORDER — DEXAMETHASONE SODIUM PHOSPHATE 10 MG/ML IJ SOLN
INTRAMUSCULAR | Status: DC | PRN
  Administered 2019-11-05: 10 mg via INTRAVENOUS

## 2019-11-05 MED ORDER — PROPOFOL 500 MG/50ML IV EMUL
INTRAVENOUS | Status: DC | PRN
  Administered 2019-11-05: 125 ug/kg/min via INTRAVENOUS

## 2019-11-05 MED ORDER — DEXAMETHASONE SODIUM PHOSPHATE 10 MG/ML IJ SOLN: 10.0000 mg | Freq: Once | INTRAMUSCULAR | Status: DC

## 2019-11-05 MED ORDER — POLYETHYLENE GLYCOL 3350 17 G PO PACK
17.0000 g | PACK | Freq: Two times a day (BID) | ORAL | Status: DC
  Filled 2019-11-05 (×2): qty 1

## 2019-11-05 MED ORDER — PROPOFOL 1000 MG/100ML IV EMUL
INTRAVENOUS | Status: AC
  Filled 2019-11-05: qty 200

## 2019-11-05 MED ORDER — DEXAMETHASONE SODIUM PHOSPHATE 10 MG/ML IJ SOLN
10.0000 mg | Freq: Once | INTRAMUSCULAR | Status: AC
  Administered 2019-11-06: 09:00:00 10 mg via INTRAVENOUS
  Filled 2019-11-05: qty 1

## 2019-11-05 MED ORDER — BUPIVACAINE IN DEXTROSE 0.75-8.25 % IT SOLN
INTRATHECAL | Status: DC | PRN
  Administered 2019-11-05: 1.8 mL via INTRATHECAL

## 2019-11-05 MED ORDER — CEFAZOLIN SODIUM-DEXTROSE 2-4 GM/100ML-% IV SOLN
2.0000 g | INTRAVENOUS | Status: AC
  Administered 2019-11-05: 2 g via INTRAVENOUS
  Filled 2019-11-05: qty 100

## 2019-11-05 MED ORDER — METHOCARBAMOL 500 MG PO TABS
500.0000 mg | ORAL_TABLET | Freq: Four times a day (QID) | ORAL | Status: DC | PRN
  Administered 2019-11-05 (×2): 500 mg via ORAL
  Filled 2019-11-05 (×2): qty 1

## 2019-11-05 MED ORDER — DOCUSATE SODIUM 100 MG PO CAPS
100.0000 mg | ORAL_CAPSULE | Freq: Two times a day (BID) | ORAL | Status: DC
  Administered 2019-11-05 – 2019-11-06 (×2): 100 mg via ORAL
  Filled 2019-11-05 (×2): qty 1

## 2019-11-05 MED ORDER — CEFAZOLIN SODIUM-DEXTROSE 2-4 GM/100ML-% IV SOLN
2.0000 g | Freq: Four times a day (QID) | INTRAVENOUS | Status: AC
  Administered 2019-11-05 (×2): 2 g via INTRAVENOUS
  Filled 2019-11-05 (×2): qty 100

## 2019-11-05 MED ORDER — ONDANSETRON HCL 4 MG/2ML IJ SOLN: 4.0000 mg | Freq: Four times a day (QID) | INTRAMUSCULAR | Status: DC | PRN

## 2019-11-05 MED ORDER — PROPOFOL 10 MG/ML IV BOLUS
INTRAVENOUS | Status: DC | PRN
  Administered 2019-11-05 (×3): 20 mg via INTRAVENOUS

## 2019-11-05 MED ORDER — PHENOL 1.4 % MT LIQD: 1.0000 | OROMUCOSAL | Status: DC | PRN

## 2019-11-05 MED ORDER — OXYCODONE HCL 5 MG/5ML PO SOLN: 5.0000 mg | Freq: Once | ORAL | Status: DC | PRN

## 2019-11-05 MED ORDER — PHENYLEPHRINE 40 MCG/ML (10ML) SYRINGE FOR IV PUSH (FOR BLOOD PRESSURE SUPPORT)
PREFILLED_SYRINGE | INTRAVENOUS | Status: DC | PRN
  Administered 2019-11-05: 80 ug via INTRAVENOUS

## 2019-11-05 MED ORDER — 0.9 % SODIUM CHLORIDE (POUR BTL) OPTIME
TOPICAL | Status: DC | PRN
  Administered 2019-11-05: 1000 mL

## 2019-11-05 MED ORDER — ONDANSETRON HCL 4 MG PO TABS: 4.0000 mg | ORAL_TABLET | Freq: Four times a day (QID) | ORAL | Status: DC | PRN

## 2019-11-05 MED ORDER — LACTATED RINGERS IV SOLN: INTRAVENOUS | Status: DC | PRN

## 2019-11-05 MED ORDER — PROPOFOL 10 MG/ML IV BOLUS
INTRAVENOUS | Status: AC
  Filled 2019-11-05: qty 20

## 2019-11-05 MED ORDER — LIDOCAINE HCL (CARDIAC) PF 100 MG/5ML IV SOSY
PREFILLED_SYRINGE | INTRAVENOUS | Status: DC | PRN
  Administered 2019-11-05: 80 mg via INTRAVENOUS

## 2019-11-05 MED ORDER — ONDANSETRON HCL 4 MG/2ML IJ SOLN
INTRAMUSCULAR | Status: DC | PRN
  Administered 2019-11-05: 4 mg via INTRAVENOUS

## 2019-11-05 MED ORDER — METHOCARBAMOL 500 MG IVPB - SIMPLE MED
500.0000 mg | Freq: Four times a day (QID) | INTRAVENOUS | Status: DC | PRN
  Filled 2019-11-05: qty 50

## 2019-11-05 MED ORDER — PHENYLEPHRINE HCL (PRESSORS) 10 MG/ML IV SOLN
INTRAVENOUS | Status: AC
  Filled 2019-11-05: qty 1

## 2019-11-05 MED ORDER — FENTANYL CITRATE (PF) 100 MCG/2ML IJ SOLN
INTRAMUSCULAR | Status: DC | PRN
  Administered 2019-11-05: 100 ug via INTRAVENOUS

## 2019-11-05 MED ORDER — FERROUS SULFATE 325 (65 FE) MG PO TABS
325.0000 mg | ORAL_TABLET | Freq: Three times a day (TID) | ORAL | Status: DC
  Administered 2019-11-06: 09:00:00 325 mg via ORAL
  Filled 2019-11-05: qty 1

## 2019-11-05 MED ORDER — FENTANYL CITRATE (PF) 100 MCG/2ML IJ SOLN
INTRAMUSCULAR | Status: AC
  Filled 2019-11-05: qty 2

## 2019-11-05 MED ORDER — MAGNESIUM CITRATE PO SOLN: 1.0000 | Freq: Once | ORAL | Status: DC | PRN

## 2019-11-05 MED ORDER — SODIUM CHLORIDE 0.9 % IV SOLN: INTRAVENOUS | Status: DC

## 2019-11-05 MED ORDER — STERILE WATER FOR IRRIGATION IR SOLN
Status: DC | PRN
  Administered 2019-11-05: 2000 mL

## 2019-11-05 MED ORDER — DIPHENHYDRAMINE HCL 12.5 MG/5ML PO ELIX: 12.5000 mg | ORAL_SOLUTION | ORAL | Status: DC | PRN

## 2019-11-05 MED ORDER — MORPHINE SULFATE (PF) 2 MG/ML IV SOLN: 0.5000 mg | INTRAVENOUS | Status: DC | PRN

## 2019-11-05 MED ORDER — HYDROCODONE-ACETAMINOPHEN 5-325 MG PO TABS
1.0000 | ORAL_TABLET | ORAL | Status: DC | PRN
  Administered 2019-11-05: 2 via ORAL
  Filled 2019-11-05: qty 2

## 2019-11-05 MED ORDER — FENTANYL CITRATE (PF) 100 MCG/2ML IJ SOLN
25.0000 ug | INTRAMUSCULAR | Status: DC | PRN
  Administered 2019-11-05: 50 ug via INTRAVENOUS

## 2019-11-05 MED ORDER — CELECOXIB 200 MG PO CAPS
200.0000 mg | ORAL_CAPSULE | Freq: Two times a day (BID) | ORAL | Status: DC
  Administered 2019-11-05 – 2019-11-06 (×2): 200 mg via ORAL
  Filled 2019-11-05 (×2): qty 1

## 2019-11-05 MED ORDER — ACETAMINOPHEN 500 MG PO TABS: 1000.0000 mg | ORAL_TABLET | Freq: Once | ORAL | Status: DC | PRN

## 2019-11-05 MED ORDER — BISACODYL 10 MG RE SUPP: 10.0000 mg | Freq: Every day | RECTAL | Status: DC | PRN

## 2019-11-05 MED ORDER — ASPIRIN 81 MG PO CHEW
81.0000 mg | CHEWABLE_TABLET | Freq: Two times a day (BID) | ORAL | Status: DC
  Administered 2019-11-05 – 2019-11-06 (×2): 81 mg via ORAL
  Filled 2019-11-05 (×2): qty 1

## 2019-11-05 MED ORDER — MIDAZOLAM HCL 5 MG/5ML IJ SOLN
INTRAMUSCULAR | Status: DC | PRN
  Administered 2019-11-05: 2 mg via INTRAVENOUS

## 2019-11-05 MED ORDER — LACTATED RINGERS IV SOLN: INTRAVENOUS | Status: DC

## 2019-11-05 MED ORDER — TRANEXAMIC ACID-NACL 1000-0.7 MG/100ML-% IV SOLN
1000.0000 mg | Freq: Once | INTRAVENOUS | Status: AC
  Administered 2019-11-05: 1000 mg via INTRAVENOUS
  Filled 2019-11-05: qty 100

## 2019-11-05 MED ORDER — ACETAMINOPHEN 325 MG PO TABS: 325.0000 mg | ORAL_TABLET | Freq: Four times a day (QID) | ORAL | Status: DC | PRN

## 2019-11-05 MED ORDER — OXYCODONE HCL 5 MG PO TABS: 5.0000 mg | ORAL_TABLET | Freq: Once | ORAL | Status: DC | PRN

## 2019-11-05 MED ORDER — METOCLOPRAMIDE HCL 5 MG/ML IJ SOLN: 5.0000 mg | Freq: Three times a day (TID) | INTRAMUSCULAR | Status: DC | PRN

## 2019-11-05 MED ORDER — TRANEXAMIC ACID-NACL 1000-0.7 MG/100ML-% IV SOLN
1000.0000 mg | INTRAVENOUS | Status: AC
  Administered 2019-11-05: 1000 mg via INTRAVENOUS
  Filled 2019-11-05: qty 100

## 2019-11-05 MED ORDER — ALUM & MAG HYDROXIDE-SIMETH 200-200-20 MG/5ML PO SUSP: 15.0000 mL | ORAL | Status: DC | PRN

## 2019-11-05 SURGICAL SUPPLY — 70 items
BAG DECANTER FOR FLEXI CONT (MISCELLANEOUS) ×3 IMPLANT
BAG ZIPLOCK 12X15 (MISCELLANEOUS) ×3 IMPLANT
BLADE FLEX CHISEL 8 2.6 (MISCELLANEOUS) ×3 IMPLANT
BLADE SAW SGTL 11.0X1.19X90.0M (BLADE) IMPLANT
BLADE SAW SGTL 18X1.27X75 (BLADE) ×2 IMPLANT
BLADE SAW SGTL 18X1.27X75MM (BLADE) ×1
BRUSH FEMORAL CANAL (MISCELLANEOUS) IMPLANT
COVER SURGICAL LIGHT HANDLE (MISCELLANEOUS) ×3 IMPLANT
COVER WAND RF STERILE (DRAPES) IMPLANT
CUP ACETBLR 52 OD PINNACLE (Hips) ×3 IMPLANT
DERMABOND ADVANCED (GAUZE/BANDAGES/DRESSINGS) ×2
DERMABOND ADVANCED .7 DNX12 (GAUZE/BANDAGES/DRESSINGS) ×1 IMPLANT
DRAPE ORTHO SPLIT 77X108 STRL (DRAPES) ×4
DRAPE POUCH INSTRU U-SHP 10X18 (DRAPES) ×3 IMPLANT
DRAPE SURG 17X11 SM STRL (DRAPES) ×3 IMPLANT
DRAPE SURG ORHT 6 SPLT 77X108 (DRAPES) ×2 IMPLANT
DRAPE U-SHAPE 47X51 STRL (DRAPES) ×3 IMPLANT
DRESSING AQUACEL AG SP 3.5X10 (GAUZE/BANDAGES/DRESSINGS) IMPLANT
DRSG AQUACEL AG ADV 3.5X10 (GAUZE/BANDAGES/DRESSINGS) IMPLANT
DRSG AQUACEL AG ADV 3.5X14 (GAUZE/BANDAGES/DRESSINGS) ×3 IMPLANT
DRSG AQUACEL AG SP 3.5X10 (GAUZE/BANDAGES/DRESSINGS)
DURAPREP 26ML APPLICATOR (WOUND CARE) ×3 IMPLANT
ELECT BLADE TIP CTD 4 INCH (ELECTRODE) ×3 IMPLANT
ELECT REM PT RETURN 15FT ADLT (MISCELLANEOUS) ×3 IMPLANT
ELIMINATOR HOLE APEX DEPUY (Hips) ×3 IMPLANT
FACESHIELD WRAPAROUND (MASK) ×12 IMPLANT
GAUZE SPONGE 2X2 8PLY STRL LF (GAUZE/BANDAGES/DRESSINGS) ×1 IMPLANT
GLOVE BIOGEL M 7.0 STRL (GLOVE) IMPLANT
GLOVE BIOGEL PI IND STRL 7.5 (GLOVE) ×1 IMPLANT
GLOVE BIOGEL PI IND STRL 8.5 (GLOVE) ×1 IMPLANT
GLOVE BIOGEL PI INDICATOR 7.5 (GLOVE) ×2
GLOVE BIOGEL PI INDICATOR 8.5 (GLOVE) ×2
GLOVE ECLIPSE 8.0 STRL XLNG CF (GLOVE) ×6 IMPLANT
GOWN STRL REUS W/TWL 2XL LVL3 (GOWN DISPOSABLE) ×3 IMPLANT
GOWN STRL REUS W/TWL LRG LVL3 (GOWN DISPOSABLE) ×3 IMPLANT
HANDPIECE INTERPULSE COAX TIP (DISPOSABLE)
HEAD CERAMIC 36 PLUS5 (Hips) ×3 IMPLANT
KIT TURNOVER KIT A (KITS) IMPLANT
LINER NEUTRAL 52X36MM PLUS 4 (Liner) ×3 IMPLANT
MANIFOLD NEPTUNE II (INSTRUMENTS) ×3 IMPLANT
MARKER SKIN DUAL TIP RULER LAB (MISCELLANEOUS) ×3 IMPLANT
NDL SAFETY ECLIPSE 18X1.5 (NEEDLE) ×1 IMPLANT
NEEDLE HYPO 18GX1.5 SHARP (NEEDLE) ×2
NS IRRIG 1000ML POUR BTL (IV SOLUTION) ×6 IMPLANT
OSTEOTOME THIN 8 5 (MISCELLANEOUS) ×3 IMPLANT
PACK TOTAL JOINT (CUSTOM PROCEDURE TRAY) ×3 IMPLANT
PADDING CAST COTTON 6X4 STRL (CAST SUPPLIES) ×3 IMPLANT
PENCIL SMOKE EVACUATOR (MISCELLANEOUS) IMPLANT
PRESSURIZER FEMORAL UNIV (MISCELLANEOUS) IMPLANT
PROTECTOR NERVE ULNAR (MISCELLANEOUS) ×3 IMPLANT
SCREW 6.5MMX30MM (Screw) ×3 IMPLANT
SET HNDPC FAN SPRY TIP SCT (DISPOSABLE) IMPLANT
SPONGE GAUZE 2X2 STER 10/PKG (GAUZE/BANDAGES/DRESSINGS) ×2
SPONGE LAP 18X18 RF (DISPOSABLE) ×3 IMPLANT
SPONGE LAP 4X18 RFD (DISPOSABLE) ×3 IMPLANT
STAPLER VISISTAT 35W (STAPLE) ×3 IMPLANT
STEM FEM ACTIS HIGH SZ3 (Stem) ×3 IMPLANT
SUCTION FRAZIER HANDLE 10FR (MISCELLANEOUS) ×2
SUCTION TUBE FRAZIER 10FR DISP (MISCELLANEOUS) ×1 IMPLANT
SUT STRATAFIX PDS+ 0 24IN (SUTURE) ×3 IMPLANT
SUT VIC AB 1 CT1 36 (SUTURE) ×3 IMPLANT
SUT VIC AB 2-0 CT1 27 (SUTURE) ×6
SUT VIC AB 2-0 CT1 TAPERPNT 27 (SUTURE) ×3 IMPLANT
TOWEL OR 17X26 10 PK STRL BLUE (TOWEL DISPOSABLE) ×6 IMPLANT
TOWER CARTRIDGE SMART MIX (DISPOSABLE) IMPLANT
TRAY FOLEY MTR SLVR 16FR STAT (SET/KITS/TRAYS/PACK) ×3 IMPLANT
TUBE KAMVAC SUCTION (TUBING) IMPLANT
WATER STERILE IRR 1000ML POUR (IV SOLUTION) ×3 IMPLANT
YANKAUER SUCT BULB TIP 10FT TU (MISCELLANEOUS) ×3 IMPLANT
YANKAUER SUCT BULB TIP NO VENT (SUCTIONS) ×3 IMPLANT
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
## 2019-11-05 NOTE — Anesthesia Preprocedure Evaluation (Addendum)
Anesthesia Evaluation  Patient identified by MRN, date of birth, ID band Patient awake    Reviewed: Allergy & Precautions, NPO status , Patient's Chart, lab work & pertinent test results  History of Anesthesia Complications Negative for: history of anesthetic complications  Airway Mallampati: II  TM Distance: >3 FB Neck ROM: Full    Dental  (+) Dental Advisory Given, Teeth Intact   Pulmonary neg recent URI, Current SmokerPatient did not abstain from smoking.,    breath sounds clear to auscultation       Cardiovascular negative cardio ROS   Rhythm:Regular     Neuro/Psych PSYCHIATRIC DISORDERS Anxiety negative neurological ROS     GI/Hepatic   Endo/Other    Renal/GU      Musculoskeletal Status post placement of antibiotic hip replacement   Abdominal   Peds  Hematology negative hematology ROS (+) Denies blood thinners   Anesthesia Other Findings   Reproductive/Obstetrics                             Anesthesia Physical Anesthesia Plan  ASA: II  Anesthesia Plan: MAC and Spinal   Post-op Pain Management:    Induction: Intravenous  PONV Risk Score and Plan: 0 and Propofol infusion and Treatment may vary due to age or medical condition  Airway Management Planned: Nasal Cannula  Additional Equipment: None  Intra-op Plan:   Post-operative Plan:   Informed Consent: I have reviewed the patients History and Physical, chart, labs and discussed the procedure including the risks, benefits and alternatives for the proposed anesthesia with the patient or authorized representative who has indicated his/her understanding and acceptance.     Dental advisory given  Plan Discussed with: CRNA and Surgeon  Anesthesia Plan Comments: (Discussed GETA with patient however he preferred a spinal. Discussed risks and benefits of spinal anesthesia including concerns for PDPH, bleeding, infection, nerve  injury, block failure. )       Anesthesia Quick Evaluation

## 2019-11-05 NOTE — Discharge Instructions (Signed)
INSTRUCTIONS AFTER JOINT REPLACEMENT   o Remove items at home which could result in a fall. This includes throw rugs or furniture in walking pathways o ICE to the affected joint every three hours while awake for 30 minutes at a time, for at least the first 3-5 days, and then as needed for pain and swelling.  Continue to use ice for pain and swelling. You may notice swelling that will progress down to the foot and ankle.  This is normal after surgery.  Elevate your leg when you are not up walking on it.   o Continue to use the breathing machine you got in the hospital (incentive spirometer) which will help keep your temperature down.  It is common for your temperature to cycle up and down following surgery, especially at night when you are not up moving around and exerting yourself.  The breathing machine keeps your lungs expanded and your temperature down.   DIET:  As you were doing prior to hospitalization, we recommend a well-balanced diet.  DRESSING / WOUND CARE / SHOWERING  Keep the surgical dressing until follow up.  The dressing is water proof, so you can shower without any extra covering.  IF THE DRESSING FALLS OFF or the wound gets wet inside, change the dressing with sterile gauze.  Please use good hand washing techniques before changing the dressing.  Do not use any lotions or creams on the incision until instructed by your surgeon.    ACTIVITY  o Increase activity slowly as tolerated, but follow the weight bearing instructions below.   o No driving for 6 weeks or until further direction given by your physician.  You cannot drive while taking narcotics.  o No lifting or carrying greater than 10 lbs. until further directed by your surgeon. o Avoid periods of inactivity such as sitting longer than an hour when not asleep. This helps prevent blood clots.  o You may return to work once you are authorized by your doctor.     WEIGHT BEARING   Partial weight bearing with assist device as  directed.  50% weight bearing right leg   EXERCISES  Results after joint replacement surgery are often greatly improved when you follow the exercise, range of motion and muscle strengthening exercises prescribed by your doctor. Safety measures are also important to protect the joint from further injury. Any time any of these exercises cause you to have increased pain or swelling, decrease what you are doing until you are comfortable again and then slowly increase them. If you have problems or questions, call your caregiver or physical therapist for advice.   Rehabilitation is important following a joint replacement. After just a few days of immobilization, the muscles of the leg can become weakened and shrink (atrophy).  These exercises are designed to build up the tone and strength of the thigh and leg muscles and to improve motion. Often times heat used for twenty to thirty minutes before working out will loosen up your tissues and help with improving the range of motion but do not use heat for the first two weeks following surgery (sometimes heat can increase post-operative swelling).   These exercises can be done on a training (exercise) mat, on the floor, on a table or on a bed. Use whatever works the best and is most comfortable for you.    Use music or television while you are exercising so that the exercises are a pleasant break in your day. This will make your life  better with the exercises acting as a break in your routine that you can look forward to.   Perform all exercises about fifteen times, three times per day or as directed.  You should exercise both the operative leg and the other leg as well.  Exercises include:   . Quad Sets - Tighten up the muscle on the front of the thigh (Quad) and hold for 5-10 seconds.   . Straight Leg Raises - With your knee straight (if you were given a brace, keep it on), lift the leg to 60 degrees, hold for 3 seconds, and slowly lower the leg.  Perform this  exercise against resistance later as your leg gets stronger.  . Leg Slides: Lying on your back, slowly slide your foot toward your buttocks, bending your knee up off the floor (only go as far as is comfortable). Then slowly slide your foot back down until your leg is flat on the floor again.  Lawanna Kobus Wings: Lying on your back spread your legs to the side as far apart as you can without causing discomfort.  . Hamstring Strength:  Lying on your back, push your heel against the floor with your leg straight by tightening up the muscles of your buttocks.  Repeat, but this time bend your knee to a comfortable angle, and push your heel against the floor.  You may put a pillow under the heel to make it more comfortable if necessary.   A rehabilitation program following joint replacement surgery can speed recovery and prevent re-injury in the future due to weakened muscles. Contact your doctor or a physical therapist for more information on knee rehabilitation.    CONSTIPATION  Constipation is defined medically as fewer than three stools per week and severe constipation as less than one stool per week.  Even if you have a regular bowel pattern at home, your normal regimen is likely to be disrupted due to multiple reasons following surgery.  Combination of anesthesia, postoperative narcotics, change in appetite and fluid intake all can affect your bowels.   YOU MUST use at least one of the following options; they are listed in order of increasing strength to get the job done.  They are all available over the counter, and you may need to use some, POSSIBLY even all of these options:    Drink plenty of fluids (prune juice may be helpful) and high fiber foods Colace 100 mg by mouth twice a day  Senokot for constipation as directed and as needed Dulcolax (bisacodyl), take with full glass of water  Miralax (polyethylene glycol) once or twice a day as needed.  If you have tried all these things and are unable to  have a bowel movement in the first 3-4 days after surgery call either your surgeon or your primary doctor.    If you experience loose stools or diarrhea, hold the medications until you stool forms back up.  If your symptoms do not get better within 1 week or if they get worse, check with your doctor.  If you experience "the worst abdominal pain ever" or develop nausea or vomiting, please contact the office immediately for further recommendations for treatment.   ITCHING:  If you experience itching with your medications, try taking only a single pain pill, or even half a pain pill at a time.  You can also use Benadryl over the counter for itching or also to help with sleep.   TED HOSE STOCKINGS:  Use stockings on both legs  until for at least 2 weeks or as directed by physician office. They may be removed at night for sleeping.  MEDICATIONS:  See your medication summary on the "After Visit Summary" that nursing will review with you.  You may have some home medications which will be placed on hold until you complete the course of blood thinner medication.  It is important for you to complete the blood thinner medication as prescribed.  PRECAUTIONS:  If you experience chest pain or shortness of breath - call 911 immediately for transfer to the hospital emergency department.   If you develop a fever greater that 101 F, purulent drainage from wound, increased redness or drainage from wound, foul odor from the wound/dressing, or calf pain - CONTACT YOUR SURGEON.                                                   FOLLOW-UP APPOINTMENTS:  If you do not already have a post-op appointment, please call the office for an appointment to be seen by your surgeon.  Guidelines for how soon to be seen are listed in your "After Visit Summary", but are typically between 1-4 weeks after surgery.  OTHER INSTRUCTIONS:   Knee Replacement:  Do not place pillow under knee, focus on keeping the knee straight while resting.  CPM instructions: 0-90 degrees, 2 hours in the morning, 2 hours in the afternoon, and 2 hours in the evening. Place foam block, curve side up under heel at all times except when in CPM or when walking.  DO NOT modify, tear, cut, or change the foam block in any way.   DENTAL ANTIBIOTICS:  In most cases prophylactic antibiotics for Dental procdeures after total joint surgery are not necessary.  Exceptions are as follows:  1. History of prior total joint infection  2. Severely immunocompromised (Organ Transplant, cancer chemotherapy, Rheumatoid biologic meds such as Humera)  3. Poorly controlled diabetes (A1C &gt; 8.0, blood glucose over 200)  If you have one of these conditions, contact your surgeon for an antibiotic prescription, prior to your dental procedure.   MAKE SURE YOU:  . Understand these instructions.  . Get help right away if you are not doing well or get worse.    Thank you for letting us be a part of your medical care team.  It is a privilege we respect greatly.  We hope these instructions will help you stay on track for a fast and full recovery!
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
## 2019-11-05 NOTE — Transfer of Care (Signed)
Immediate Anesthesia Transfer of Care Note  Patient: Joseph Stokes  Procedure(s) Performed: TOTAL HIP REVISION (Right Hip)  Patient Location: PACU  Anesthesia Type:Spinal  Level of Consciousness: sedated  Airway & Oxygen Therapy: Patient Spontanous Breathing and Patient connected to face mask oxygen  Post-op Assessment: Report given to RN  Post vital signs: Reviewed and stable  Last Vitals:  Vitals Value Taken Time  BP    Temp    Pulse    Resp 16 11/05/19 1044  SpO2    Vitals shown include unvalidated device data.  Last Pain:  Vitals:   11/05/19 0701  TempSrc: Oral      Patients Stated Pain Goal: 5 (11/05/19 0701)  Complications: No apparent anesthesia complications
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
## 2019-11-05 NOTE — Plan of Care (Signed)

## 2019-11-05 NOTE — Brief Op Note (Signed)
11/05/2019  10:11 AM  PATIENT:  Joseph Stokes  29 y.o. male  PRE-OPERATIVE DIAGNOSIS:  Status post placement of antibiotic hip replacement  POST-OPERATIVE DIAGNOSIS:  Status post placement of antibiotic hip replacement  PROCEDURE:  Procedure(s) with comments: TOTAL HIP REVISION (Right) - 90 mins  SURGEON:  Surgeon(s) and Role:    Durene Romans, MD - Primary  PHYSICIAN ASSISTANT: Lanney Gins, PA-C  ANESTHESIA:   spinal  EBL:  250 mL   BLOOD ADMINISTERED:none  DRAINS: none   LOCAL MEDICATIONS USED:  NONE  SPECIMEN:  No Specimen  DISPOSITION OF SPECIMEN:  N/A  COUNTS:  YES  TOURNIQUET:  * No tourniquets in log *  DICTATION: .Other Dictation: Dictation Number [id number]  PLAN OF CARE: Admit to inpatient   PATIENT DISPOSITION:  PACU - hemodynamically stable.   Delay start of Pharmacological VTE agent (>24hrs) due to surgical blood loss or risk of bleeding: no
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
## 2019-11-05 NOTE — Interval H&P Note (Signed)
History and Physical Interval Note:  11/05/2019 6:53 AM  Joseph Stokes  has presented today for surgery, with the diagnosis of Status post placement of antibiotic hip replacement.  The various methods of treatment have been discussed with the patient and family. After consideration of risks, benefits and other options for treatment, the patient has consented to  Procedure(s) with comments: TOTAL HIP REVISION (Right) - 90 mins as a surgical intervention.  The patient's history has been reviewed, patient examined, no change in status, stable for surgery.  I have reviewed the patient's chart and labs.  Questions were answered to the patient's satisfaction.     [name]

## 2019-11-06 ENCOUNTER — Encounter: Payer: Self-pay | Admitting: *Deleted

## 2019-11-06 ENCOUNTER — Encounter: Payer: Self-pay | Admitting: Radiation Oncology

## 2019-11-06 ENCOUNTER — Ambulatory Visit: Payer: Medicaid Other

## 2019-11-06 ENCOUNTER — Ambulatory Visit
Admit: 2019-11-06 | Discharge: 2019-11-06 | Disposition: A | Discharge status: Home / Self Care | Payer: Medicaid Other | Attending: Radiation Oncology | Admitting: Radiation Oncology

## 2019-11-06 ENCOUNTER — Ambulatory Visit: Payer: Medicaid Other | Admitting: Radiation Oncology

## 2019-11-06 DIAGNOSIS — S72051B Unspecified fracture of head of right femur, initial encounter for open fracture type I or II: Secondary | ICD-10-CM | POA: Diagnosis not present

## 2019-11-06 DIAGNOSIS — E663 Overweight: Secondary | ICD-10-CM | POA: Diagnosis present

## 2019-11-06 LAB — BASIC METABOLIC PANEL
Anion gap: 6 (ref 5–15)
BUN: 8 mg/dL (ref 6–20)
CO2: 25 mmol/L (ref 22–32)
Calcium: 8.7 mg/dL — ABNORMAL LOW (ref 8.9–10.3)
Chloride: 109 mmol/L (ref 98–111)
Creatinine, Ser: 0.66 mg/dL (ref 0.61–1.24)
GFR calc Af Amer: >60 mL/min (ref >60)
GFR calc non Af Amer: >60 mL/min (ref >60)
Glucose, Bld: 123 mg/dL — ABNORMAL HIGH (ref 70–99)
Potassium: 3.8 mmol/L (ref 3.5–5.1)
Sodium: 140 mmol/L (ref 135–145)

## 2019-11-06 LAB — CBC
HCT: 34.9 % — ABNORMAL LOW (ref 39.0–52.0)
Hemoglobin: 11.5 g/dL — ABNORMAL LOW (ref 13.0–17.0)
MCH: 30.4 pg (ref 26.0–34.0)
MCHC: 33 g/dL (ref 30.0–36.0)
MCV: 92.3 fL (ref 80.0–100.0)
Platelets: 197 10*3/uL (ref 150–400)
RBC: 3.78 MIL/uL — ABNORMAL LOW (ref 4.22–5.81)
RDW: 13.6 % (ref 11.5–15.5)
WBC: 13.9 10*3/uL — ABNORMAL HIGH (ref 4.0–10.5)
nRBC: 0 % (ref 0.0–0.2)

## 2019-11-06 MED ORDER — FERROUS SULFATE 325 (65 FE) MG PO TABS: 325.0000 mg | ORAL_TABLET | Freq: Three times a day (TID) | ORAL | 0 refills | Status: AC

## 2019-11-06 MED ORDER — DOCUSATE SODIUM 100 MG PO CAPS: 100.0000 mg | ORAL_CAPSULE | Freq: Two times a day (BID) | ORAL | 0 refills | Status: AC

## 2019-11-06 MED ORDER — ASPIRIN 81 MG PO CHEW: 81.0000 mg | CHEWABLE_TABLET | Freq: Two times a day (BID) | ORAL | 0 refills | Status: AC

## 2019-11-06 MED ORDER — POLYETHYLENE GLYCOL 3350 17 G PO PACK: 17.0000 g | PACK | Freq: Two times a day (BID) | ORAL | 0 refills | Status: AC

## 2019-11-06 MED ORDER — HYDROCODONE-ACETAMINOPHEN 7.5-325 MG PO TABS: 1.0000 | ORAL_TABLET | ORAL | 0 refills | Status: AC | PRN

## 2019-11-06 MED ORDER — METHOCARBAMOL 500 MG PO TABS: 500.0000 mg | ORAL_TABLET | Freq: Four times a day (QID) | ORAL | 0 refills | Status: AC | PRN

## 2019-11-06 NOTE — TOC Progression Note (Signed)
Transition of Care [facility]) - Progression Note    Patient Details  Name: Gotham Raden MRN: [id number] Date of Birth: [birth year]  [facility] CM/SW Contact  Clearance Coots, LCSW Phone Number: 11/06/2019, 9:31 AM  Clinical Narrative:    Home  Has RW and 3 in1    Expected Discharge Plan: Home/Self Care Barriers to Discharge: Barriers Resolved  Expected Discharge Plan and Services Expected Discharge Plan: Home/Self Care         Expected Discharge Date: 11/06/19               DME Arranged: N/A(Has RW and 3 in1) DME Agency: NA       HH Arranged: NA HH Agency: NA         Social Determinants of Health (SDOH) Interventions    Readmission Risk Interventions No flowsheet data found.
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
## 2019-11-06 NOTE — Plan of Care (Signed)
Plan of care reviewed and discussed with the patient. 

## 2019-11-06 NOTE — Progress Notes (Signed)
     Subjective: 1 Day Post-Op Procedure(s) (LRB): TOTAL HIP REVISION (Right)   Patient reports pain as mild, pain controlled medication.  No reported events throughout the night.  Dr. Charlann Boxer discussed the procedure, findings and expectations moving forward.  The patient is ready be discharged home, if he does well therapy.  He will follow up in the clinic in 2 weeks.  Patient knows to call with any questions or concerns.     Objective:   VITALS:   Vitals:   11/06/19 0128 11/06/19 0539  BP: (!) 108/48 112/61  Pulse: 63 62  Resp: 18 18  Temp: 98.7 F (37.1 C) 98.3 F (36.8 C)  SpO2: 99% 100%    Dorsiflexion/Plantar flexion intact Incision: dressing C/D/I No cellulitis present Compartment soft  LABS Recent Labs    11/06/19 0308  HGB 11.5*  HCT 34.9*  WBC 13.9*  PLT 197    Recent Labs    11/06/19 0308  NA 140  K 3.8  BUN 8  CREATININE 0.66  GLUCOSE 123*     Assessment/Plan: 1 Day Post-Op Procedure(s) (LRB): TOTAL HIP REVISION (Right) PWB right LE Foley cath out yesterday Advance diet Up with therapy D/C IV fluids Discharge home Follow up in 2 weeks at [facility] Follow up with OLIN,Sherissa Tenenbaum D in 2 weeks.  Contact information:  [facility] [address], [address] [place] [address] [phone]    Overweight (BMI 25-29.9) Estimated body mass index is 26.32 kg/m as calculated from the following:   Height as of this encounter: 5\' 4"  (1.626 m).   Weight as of this encounter: 69.5 kg. Patient also counseled that weight may inhibit the healing process Patient counseled that losing weight will help with future health issues        PA-C  [facility]  [place] [address], [address], [place], [place] Phone: [phone] [email or web address] Facebook  [phone]

## 2019-11-13 ENCOUNTER — Encounter: Payer: Self-pay | Admitting: Anesthesiology

## 2019-11-13 NOTE — Anesthesia Postprocedure Evaluation (Signed)
Anesthesia Post Note  Patient: Joseph Stokes  Procedure(s) Performed: TOTAL HIP REVISION (Right Hip)     Patient location during evaluation: PACU Anesthesia Type: MAC and Spinal Level of consciousness: awake and alert Pain management: pain level controlled Vital Signs Assessment: post-procedure vital signs reviewed and stable Respiratory status: spontaneous breathing, nonlabored ventilation, respiratory function stable and patient connected to nasal cannula oxygen Cardiovascular status: stable and blood pressure returned to baseline Postop Assessment: no apparent nausea or vomiting and spinal receding Anesthetic complications: no    Last Vitals:  Vitals:   11/06/19 0539 11/06/19 0934  BP: 112/61 115/71  Pulse: 62 62  Resp: 18 18  Temp: 36.8 C 36.7 C  SpO2: 100% 100%    Last Pain:  Vitals:   11/06/19 0934  TempSrc: Oral  PainSc:                  Virtie Bungert
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
## 2019-11-26 NOTE — Progress Notes (Signed)
  Radiation Oncology         [phone] ________________________________  Name: Joseph Stokes MRN: [id number]  Date: 11/06/2019  DOB: 1990-10-14  End of Treatment Note  Diagnosis:   Postoperative heterotopic ossification     Indication for treatment:  curative       Radiation treatment dates:   11/06/19  Site/dose:   The patient was treated to a dose of 7 Gy to the right hip. This was accomplished with AP and PA fields.  Narrative: The patient tolerated radiation treatment relatively well.   No difficulties.  Plan: The patient has completed radiation treatment. The patient will return to radiation oncology clinic on an as needed basis. ________________________________  Radene Gunning, M.D., Ph.D.
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
